# Patient Record
Sex: Male | Born: 1970 | Race: White | Hispanic: No | Marital: Married | Smoking: Never smoker
Health system: Southern US, Community
[De-identification: ages and names within clinical notes are randomized; demographics above are authoritative.]

## PROBLEM LIST (undated history)

## (undated) DIAGNOSIS — E7800 Pure hypercholesterolemia, unspecified: Secondary | ICD-10-CM

## (undated) DIAGNOSIS — E119 Type 2 diabetes mellitus without complications: Secondary | ICD-10-CM

## (undated) HISTORY — PX: ROTATOR CUFF REPAIR: SHX139

---

## 2014-12-30 ENCOUNTER — Ambulatory Visit (HOSPITAL_BASED_OUTPATIENT_CLINIC_OR_DEPARTMENT_OTHER): Payer: BLUE CROSS/BLUE SHIELD | Admitting: Anesthesiology

## 2014-12-30 ENCOUNTER — Ambulatory Visit (HOSPITAL_BASED_OUTPATIENT_CLINIC_OR_DEPARTMENT_OTHER)
Admission: EM | Admit: 2014-12-30 | Discharge: 2014-12-30 | Disposition: A | Discharge status: Home / Self Care | Payer: BLUE CROSS/BLUE SHIELD | Source: Ambulatory Visit | Attending: Orthopedic Surgery | Admitting: Orthopedic Surgery

## 2014-12-30 ENCOUNTER — Encounter (HOSPITAL_BASED_OUTPATIENT_CLINIC_OR_DEPARTMENT_OTHER)
Admission: EM | Disposition: A | Discharge status: Home / Self Care | Payer: Self-pay | Source: Ambulatory Visit | Attending: Orthopedic Surgery

## 2014-12-30 ENCOUNTER — Encounter (HOSPITAL_BASED_OUTPATIENT_CLINIC_OR_DEPARTMENT_OTHER): Payer: Self-pay | Admitting: Orthopedic Surgery

## 2014-12-30 ENCOUNTER — Other Ambulatory Visit: Payer: Self-pay | Admitting: Orthopedic Surgery

## 2014-12-30 DIAGNOSIS — Y939 Activity, unspecified: Secondary | ICD-10-CM | POA: Insufficient documentation

## 2014-12-30 DIAGNOSIS — Y929 Unspecified place or not applicable: Secondary | ICD-10-CM | POA: Diagnosis not present

## 2014-12-30 DIAGNOSIS — Y999 Unspecified external cause status: Secondary | ICD-10-CM | POA: Insufficient documentation

## 2014-12-30 DIAGNOSIS — W228XXA Striking against or struck by other objects, initial encounter: Secondary | ICD-10-CM | POA: Diagnosis not present

## 2014-12-30 DIAGNOSIS — T704XXA Effects of high-pressure fluids, initial encounter: Secondary | ICD-10-CM | POA: Insufficient documentation

## 2014-12-30 DIAGNOSIS — Z79899 Other long term (current) drug therapy: Secondary | ICD-10-CM | POA: Insufficient documentation

## 2014-12-30 HISTORY — PX: INCISION AND DRAINAGE OF WOUND: SHX1803

## 2014-12-30 HISTORY — DX: Type 2 diabetes mellitus without complications: E11.9

## 2014-12-30 HISTORY — DX: Pure hypercholesterolemia, unspecified: E78.00

## 2014-12-30 LAB — POCT HEMOGLOBIN-HEMACUE: Hemoglobin: 14.7 g/dL (ref 13.0–17.0)

## 2014-12-30 SURGERY — IRRIGATION AND DEBRIDEMENT WOUND
Anesthesia: Monitor Anesthesia Care | Site: Finger | Laterality: Right

## 2014-12-30 MED ORDER — MIDAZOLAM HCL 2 MG/2ML IJ SOLN
INTRAMUSCULAR | Status: AC
  Filled 2014-12-30: qty 2

## 2014-12-30 MED ORDER — CEFAZOLIN SODIUM-DEXTROSE 2-3 GM-% IV SOLR
INTRAVENOUS | Status: AC
  Filled 2014-12-30: qty 50

## 2014-12-30 MED ORDER — FENTANYL CITRATE 0.05 MG/ML IJ SOLN: 25.0000 ug | INTRAMUSCULAR | Status: DC | PRN

## 2014-12-30 MED ORDER — FENTANYL CITRATE 0.05 MG/ML IJ SOLN
INTRAMUSCULAR | Status: AC
  Filled 2014-12-30: qty 4

## 2014-12-30 MED ORDER — ONDANSETRON HCL 4 MG/2ML IJ SOLN
INTRAMUSCULAR | Status: DC | PRN
  Administered 2014-12-30: 4 mg via INTRAVENOUS

## 2014-12-30 MED ORDER — FENTANYL CITRATE 0.05 MG/ML IJ SOLN: 50.0000 ug | INTRAMUSCULAR | Status: DC | PRN

## 2014-12-30 MED ORDER — FENTANYL CITRATE 0.05 MG/ML IJ SOLN
INTRAMUSCULAR | Status: DC | PRN
  Administered 2014-12-30 (×2): 50 ug via INTRAVENOUS

## 2014-12-30 MED ORDER — HYDROCODONE-ACETAMINOPHEN 5-325 MG PO TABS: 1.0000 | ORAL_TABLET | Freq: Four times a day (QID) | ORAL | Status: DC | PRN

## 2014-12-30 MED ORDER — CEFAZOLIN SODIUM-DEXTROSE 2-3 GM-% IV SOLR
INTRAVENOUS | Status: DC | PRN
Start: 2014-12-30 — End: 2014-12-30
  Administered 2014-12-30: 2 g via INTRAVENOUS

## 2014-12-30 MED ORDER — CHLORHEXIDINE GLUCONATE 4 % EX LIQD: 60.0000 mL | Freq: Once | CUTANEOUS | Status: DC

## 2014-12-30 MED ORDER — CEPHALEXIN 500 MG PO CAPS: 500.0000 mg | ORAL_CAPSULE | Freq: Four times a day (QID) | ORAL | Status: DC

## 2014-12-30 MED ORDER — CEFAZOLIN SODIUM-DEXTROSE 2-3 GM-% IV SOLR: 2.0000 g | INTRAVENOUS | Status: DC

## 2014-12-30 MED ORDER — LACTATED RINGERS IV SOLN
INTRAVENOUS | Status: DC
  Administered 2014-12-30: 14:00:00 via INTRAVENOUS

## 2014-12-30 MED ORDER — ONDANSETRON HCL 4 MG/2ML IJ SOLN: 4.0000 mg | Freq: Four times a day (QID) | INTRAMUSCULAR | Status: DC | PRN

## 2014-12-30 MED ORDER — OXYCODONE HCL 5 MG PO TABS: 5.0000 mg | ORAL_TABLET | Freq: Once | ORAL | Status: AC | PRN

## 2014-12-30 MED ORDER — MIDAZOLAM HCL 2 MG/2ML IJ SOLN: 1.0000 mg | INTRAMUSCULAR | Status: DC | PRN

## 2014-12-30 MED ORDER — PROPOFOL INFUSION 10 MG/ML OPTIME
INTRAVENOUS | Status: DC | PRN
  Administered 2014-12-30: 75 ug/kg/min via INTRAVENOUS

## 2014-12-30 MED ORDER — OXYCODONE HCL 5 MG/5ML PO SOLN: 5.0000 mg | Freq: Once | ORAL | Status: AC | PRN

## 2014-12-30 MED ORDER — MIDAZOLAM HCL 5 MG/5ML IJ SOLN
INTRAMUSCULAR | Status: DC | PRN
  Administered 2014-12-30: 2 mg via INTRAVENOUS

## 2014-12-30 MED ORDER — LIDOCAINE HCL (PF) 0.5 % IJ SOLN
INTRAMUSCULAR | Status: DC | PRN
  Administered 2014-12-30: 20 mL via INTRAVENOUS

## 2014-12-30 MED ORDER — BUPIVACAINE HCL (PF) 0.25 % IJ SOLN
INTRAMUSCULAR | Status: DC | PRN
  Administered 2014-12-30: 8 mL

## 2014-12-30 SURGICAL SUPPLY — 47 items
BAG DECANTER FOR FLEXI CONT (MISCELLANEOUS) IMPLANT
BLADE MINI RND TIP GREEN BEAV (BLADE) IMPLANT
BLADE SURG 15 STRL LF DISP TIS (BLADE) ×1 IMPLANT
BLADE SURG 15 STRL SS (BLADE) ×1
BNDG COHESIVE 1X5 TAN STRL LF (GAUZE/BANDAGES/DRESSINGS) ×2 IMPLANT
BNDG COHESIVE 3X5 TAN STRL LF (GAUZE/BANDAGES/DRESSINGS) IMPLANT
BNDG ESMARK 4X9 LF (GAUZE/BANDAGES/DRESSINGS) IMPLANT
BNDG GAUZE ELAST 4 BULKY (GAUZE/BANDAGES/DRESSINGS) IMPLANT
CHLORAPREP W/TINT 26ML (MISCELLANEOUS) ×2 IMPLANT
CORDS BIPOLAR (ELECTRODE) ×2 IMPLANT
COVER BACK TABLE 60X90IN (DRAPES) ×2 IMPLANT
COVER MAYO STAND STRL (DRAPES) ×2 IMPLANT
CUFF TOURNIQUET SINGLE 18IN (TOURNIQUET CUFF) ×2 IMPLANT
DRAPE EXTREMITY T 121X128X90 (DRAPE) ×2 IMPLANT
DRAPE SURG 17X23 STRL (DRAPES) ×2 IMPLANT
DRSG KUZMA FLUFF (GAUZE/BANDAGES/DRESSINGS) IMPLANT
GAUZE PACKING IODOFORM 1/4X15 (GAUZE/BANDAGES/DRESSINGS) ×2 IMPLANT
GAUZE SPONGE 4X4 12PLY STRL (GAUZE/BANDAGES/DRESSINGS) ×2 IMPLANT
GAUZE XEROFORM 1X8 LF (GAUZE/BANDAGES/DRESSINGS) IMPLANT
GLOVE BIO SURGEON STRL SZ 6.5 (GLOVE) ×2 IMPLANT
GLOVE BIOGEL PI IND STRL 7.0 (GLOVE) ×1 IMPLANT
GLOVE BIOGEL PI IND STRL 8.5 (GLOVE) ×1 IMPLANT
GLOVE BIOGEL PI INDICATOR 7.0 (GLOVE) ×1
GLOVE BIOGEL PI INDICATOR 8.5 (GLOVE) ×1
GLOVE SURG ORTHO 8.0 STRL STRW (GLOVE) ×2 IMPLANT
GOWN STRL REUS W/ TWL LRG LVL3 (GOWN DISPOSABLE) ×1 IMPLANT
GOWN STRL REUS W/TWL LRG LVL3 (GOWN DISPOSABLE) ×1
GOWN STRL REUS W/TWL XL LVL3 (GOWN DISPOSABLE) ×4 IMPLANT
LOOP VESSEL MAXI BLUE (MISCELLANEOUS) IMPLANT
NEEDLE PRECISIONGLIDE 27X1.5 (NEEDLE) ×2 IMPLANT
NS IRRIG 1000ML POUR BTL (IV SOLUTION) ×2 IMPLANT
PACK BASIN DAY SURGERY FS (CUSTOM PROCEDURE TRAY) ×2 IMPLANT
PAD CAST 3X4 CTTN HI CHSV (CAST SUPPLIES) IMPLANT
PADDING CAST ABS 4INX4YD NS (CAST SUPPLIES)
PADDING CAST ABS COTTON 4X4 ST (CAST SUPPLIES) IMPLANT
PADDING CAST COTTON 3X4 STRL (CAST SUPPLIES)
SPLINT FINGER 3.25 BULB 911905 (SOFTGOODS) ×2 IMPLANT
SPLINT PLASTER CAST XFAST 3X15 (CAST SUPPLIES) IMPLANT
SPLINT PLASTER XTRA FASTSET 3X (CAST SUPPLIES)
STOCKINETTE 4X48 STRL (DRAPES) ×2 IMPLANT
SWAB COLLECTION DEVICE MRSA (MISCELLANEOUS) ×2 IMPLANT
SYR BULB 3OZ (MISCELLANEOUS) ×2 IMPLANT
SYR CONTROL 10ML LL (SYRINGE) ×2 IMPLANT
TOWEL OR 17X24 6PK STRL BLUE (TOWEL DISPOSABLE) ×2 IMPLANT
TUBE ANAEROBIC SPECIMEN COL (MISCELLANEOUS) ×2 IMPLANT
TUBE FEEDING 5FR 15 INCH (TUBING) IMPLANT
UNDERPAD 30X30 INCONTINENT (UNDERPADS AND DIAPERS) IMPLANT

## 2014-12-30 NOTE — H&P (Signed)
   Carlos [name] is an 44 y.o. male.   Chief Complaint: sweling right index finger HPI: [age] yo right hand dominant male who suffer injection injury to right index finger when his paint gun failed to work. He pressed the tip and the gun functioned. The paint is latex based. The injury occurred on Feb 27. 2016. He did not seek attention until today, when the finger started swelling on the pulp of the distal phalanx. He has no prior injury. He is borderline diabetic. PMH: Allergy - none         Meds: statin, valtrex, flownase         Surgery: rotator cuff repair  FH: MI father SH: Smoke - none        ETOH: occas beer ROS: Neg  History reviewed. No pertinent past medical history.  History reviewed. No pertinent past surgical history.  History reviewed. No pertinent family history. Social History:  has no tobacco, alcohol, and drug history on file.  Allergies: Not on File  No prescriptions prior to admission    No results found for this or any previous visit (from the past 48 hour(s)).  No results found.   Pertinent items are noted in HPI.  There were no vitals taken for this visit.  General appearance: alert, cooperative and appears stated age Head: Normocephalic, without obvious abnormality Neck: no JVD Resp: clear to auscultation bilaterally Cardio: regular rate and rhythm, S1, S2 normal, no murmur, click, rub or gallop GI: soft, non-tender; bowel sounds normal; no masses,  no organomegaly Extremities: swelling redness with puncture wound right index pulp Pulses: 2+ and symmetric Skin: erythema and swelling right index finger Neurologic: Grossly normal Incision/Wound: Puncture wound right index finger  Assessment/Plan Injection paint right index finger Plan I and D  Pt is aware of the severity of the injury infection, stiffness, loss of sensation,including the potential  of loss part or all of the finger.  Carlos [name] R 12/30/2014, 10:37 AM

## 2014-12-30 NOTE — Op Note (Signed)
Dictation Number [id number]

## 2014-12-30 NOTE — Anesthesia Postprocedure Evaluation (Signed)
Anesthesia Post Note  Patient: Carlos NineRobert B [name]  Procedure(s) Performed: Procedure(s) (LRB): INCISION AND DRAINAGE RIGHT FLEXOR SHEATH   (Right)  Anesthesia type: MAC  Patient location: PACU  Post pain: Pain level controlled and Adequate analgesia  Post assessment: Post-op Vital signs reviewed, Patient's Cardiovascular Status Stable and Respiratory Function Stable  Last Vitals:  Filed Vitals:   12/30/14 1630  BP: 126/79  Pulse: 52  Temp: 36.6 C  Resp: 16    Post vital signs: Reviewed and stable  Level of consciousness: awake, alert  and oriented  Complications: No apparent anesthesia complications

## 2014-12-30 NOTE — Transfer of Care (Signed)
Immediate Anesthesia Transfer of Care Note  Patient: Carlos [name]  Procedure(s) Performed: Procedure(s): INCISION AND DRAINAGE RIGHT FLEXOR SHEATH   (Right)  Patient Location: PACU  Anesthesia Type:Bier block  Level of Consciousness: awake and patient cooperative  Airway & Oxygen Therapy: Patient Spontanous Breathing and Patient connected to face mask oxygen  Post-op Assessment: Report given to RN and Post -op Vital signs reviewed and stable  Post vital signs: Reviewed and stable  Last Vitals:  Filed Vitals:   12/30/14 1610  BP:   Pulse: 49  Temp:   Resp: 11    Complications: No apparent anesthesia complications

## 2014-12-30 NOTE — Brief Op Note (Signed)
12/30/2014  4:01 PM  PATIENT:  Carlos [name]  44 y.o. male  PRE-OPERATIVE DIAGNOSIS:  injection injury right index finger   POST-OPERATIVE DIAGNOSIS:  injection injury right index finger   PROCEDURE:  Procedure(s): INCISION AND DRAINAGE RIGHT FLEXOR SHEATH   (Right)  SURGEON:  Surgeon(s) and Role:    * Cindee SaltGary Kylyn Sookram, MD - Primary    * Betha LoaKevin Jayse Hodkinson, MD - Assisting  PHYSICIAN ASSISTANT:   ASSISTANTS: K Faduma Cho,MD   ANESTHESIA:   regional and local  EBL:     BLOOD ADMINISTERED:none  DRAINS: packed  LOCAL MEDICATIONS USED:  BUPIVICAINE   SPECIMEN:  Source of Specimen:  cultures  DISPOSITION OF SPECIMEN:  PATHOLOGY  COUNTS:  YES  TOURNIQUET:   Total Tourniquet Time Documented: Forearm (Right) - 20 minutes Total: Forearm (Right) - 20 minutes   DICTATION: .Other Dictation: Dictation Number [id number]  PLAN OF CARE: Discharge to home after PACU  PATIENT DISPOSITION:  PACU - hemodynamically stable.

## 2014-12-30 NOTE — Anesthesia Preprocedure Evaluation (Signed)
Anesthesia Evaluation  Patient identified by MRN, date of birth, ID band Patient awake    Reviewed: Allergy & Precautions, NPO status , Patient's Chart, lab work & pertinent test results  Airway Mallampati: II   Neck ROM: full    Dental   Pulmonary neg pulmonary ROS,          Cardiovascular negative cardio ROS      Neuro/Psych    GI/Hepatic   Endo/Other    Renal/GU      Musculoskeletal   Abdominal   Peds  Hematology   Anesthesia Other Findings   Reproductive/Obstetrics                             Anesthesia Physical Anesthesia Plan  ASA: II  Anesthesia Plan: MAC   Post-op Pain Management:    Induction: Intravenous  Airway Management Planned: Simple Face Mask  Additional Equipment:   Intra-op Plan:   Post-operative Plan:   Informed Consent: I have reviewed the patients History and Physical, chart, labs and discussed the procedure including the risks, benefits and alternatives for the proposed anesthesia with the patient or authorized representative who has indicated his/her understanding and acceptance.     Plan Discussed with: CRNA, Anesthesiologist and Surgeon  Anesthesia Plan Comments:         Anesthesia Quick Evaluation

## 2014-12-30 NOTE — Anesthesia Procedure Notes (Addendum)
Anesthesia Regional Block:  Bier block (IV Regional)  Pre-Anesthetic Checklist: ,, timeout performed, Correct Patient, Correct Site, Correct Laterality, Correct Procedure, Correct Position, site marked, Risks and benefits discussed,  Surgical consent,  Pre-op evaluation,  At surgeon's request and post-op pain management  Laterality: Right     Bier block (IV Regional) Narrative:   Performed by: Personally

## 2014-12-30 NOTE — Discharge Instructions (Addendum)
[facility] Instructions °Hand Surgery ° °Wound Care: °Keep your hand elevated above the level of your heart.  Do not allow it to dangle by your side.  Keep the dressing dry and do not remove it unless your doctor advises you to do so.  He will usually change it at the time of your post-op visit.  Moving your fingers is advised to stimulate circulation but will depend on the site of your surgery.  If you have a splint applied, your doctor will advise you regarding movement. ° °Activity: °Do not drive or operate machinery today.  Rest today and then you may return to your normal activity and work as indicated by your physician. ° °Diet:  °Drink liquids today or eat a light diet.  You may resume a regular diet tomorrow.   ° °General expectations: °Pain for two to three days. °Fingers may become slightly swollen. ° °Call your doctor if any of the following occur: °Severe pain not relieved by pain medication. °Elevated temperature. °Dressing soaked with blood. °Inability to move fingers. °White or bluish color to fingers. ° ° ° °Post Anesthesia Home Care Instructions ° °Activity: °Get plenty of rest for the remainder of the day. A responsible adult should stay with you for 24 hours following the procedure.  °For the next 24 hours, DO NOT: °-Drive a car °-Operate machinery °-Drink alcoholic beverages °-Take any medication unless instructed by your physician °-Make any legal decisions or sign important papers. ° °Meals: °Start with liquid foods such as gelatin or soup. Progress to regular foods as tolerated. Avoid greasy, spicy, heavy foods. If nausea and/or vomiting occur, drink only clear liquids until the nausea and/or vomiting subsides. Call your physician if vomiting continues. ° °Special Instructions/Symptoms: °Your throat may feel dry or sore from the anesthesia or the breathing tube placed in your throat during surgery. If this causes discomfort, gargle with warm salt water. The discomfort should disappear within  24 hours. ° °Call your surgeon if you experience:  ° °1.  Fever over 101.0. °2.  Inability to urinate. °3.  Nausea and/or vomiting. °4.  Extreme swelling or bruising at the surgical site. °5.  Continued bleeding from the incision. °6.  Increased pain, redness or drainage from the incision. °7.  Problems related to your pain medication. °8. Any change in color, movement and/or sensation °9. Any problems and/or concerns ° ° ° °

## 2014-12-31 ENCOUNTER — Encounter (HOSPITAL_BASED_OUTPATIENT_CLINIC_OR_DEPARTMENT_OTHER): Payer: Self-pay | Admitting: Orthopedic Surgery

## 2014-12-31 NOTE — Op Note (Signed)
NAMGrace Isaac:  [name], Carlos                 ACCOUNT NO.:  [id number]  MEDICAL RECORD NO.:  [id number]  LOCATION:                                 FACILITY:  PHYSICIAN:  Cindee SaltGary Shantella Blubaugh, M.D.            DATE OF BIRTH:  DATE OF PROCEDURE:  12/30/2014 DATE OF DISCHARGE:                              OPERATIVE REPORT   PREOPERATIVE DIAGNOSIS:  Injection injury with paint, right index finger.  POSTOPERATIVE DIAGNOSIS:  Injection injury with paint, right index finger.  OPERATION:  Debridement of right index finger pulp.  SURGEON:  Cindee SaltGary Lylee Corrow, M.D.  ASSISTANT:  Betha LoaKevin Baylin Gamblin, MD  ANESTHESIA:  Forearm IV regional with metacarpal block.  ANESTHESIOLOGIST:  Achille RichAdam Hodierne, MD.  HISTORY:  The patient is a 44 year old right-hand-dominant male who was attempting to clear a paint gun, which was appeared to be malfunctioning, was not working.  He hit the tip, this resulted in an injection of latex paint into the tip of his right index finger.  The injury occurred approximately 3 days ago.  He is seen with increased swelling, pain, and discomfort.  He has advised to undergo incision and drainage of this.  X-rays revealed the paint localized to the pulp of his right index finger.  He is aware of risks and complications including infection, significant scarring, loss of sensibility, stiffness, potentially even loss of the finger.  He is elected to proceed with debridement.  In preoperative area, the patient is seen, the extremity marked by both patient and surgeon.  Antibiotic given.  PROCEDURE IN DETAIL:  The patient was brought to the operating room where forearm IV regional anesthetic was carried out without difficulty. He was prepped using ChloraPrep in supine position with the right arm free.  A 3-minute dry time was allowed.  Time-out taken, confirming the patient and procedure.  A metacarpal block was given with 0.25% bupivacaine without epinephrine approximately 8 mL was used.  The incision was  made directly over the entrance wound of the injection on the pulp carried down through subcutaneous tissue.  White paint was immediately encountered.  This was isolated, debrided as much as possible.  It appeared to go distally rather than proximally.  The tissues surrounding the flexor tendon did not show any paint in the flexor sheath distally and as such it was not opened.  The area was thoroughly debrided, irrigated, and cultures were taken before irrigation for both aerobic and anaerobic cultures.  Following the irrigation, the area was packed open.  The skin edges were sharply debrided as was the tissue with the paint imbedded within it.  A sterile compressive dressing and splint to the finger applied.  On deflation of the tourniquet, remaining fingers pinked.  He was taken to the recovery room for observation in satisfactory condition.  [facility] be [facility].  He will be discharged on Norco and Keflex.          ______________________________ Cindee SaltGary Jasha Hodzic, M.D.     GK/MEDQ  D:  12/30/2014  T:  12/31/2014  Job:  [id number]

## 2015-01-02 LAB — WOUND CULTURE: Culture: NO GROWTH

## 2015-01-04 LAB — ANAEROBIC CULTURE

## 2016-02-24 ENCOUNTER — Encounter: Payer: Self-pay | Admitting: Sports Medicine

## 2016-02-24 ENCOUNTER — Ambulatory Visit (INDEPENDENT_AMBULATORY_CARE_PROVIDER_SITE_OTHER): Payer: 59 | Admitting: Sports Medicine

## 2016-02-24 VITALS — BP 135/79 | HR 84 | Ht 73.0 in | Wt 230.0 lb

## 2016-02-24 DIAGNOSIS — M25462 Effusion, left knee: Secondary | ICD-10-CM

## 2016-02-24 MED ORDER — MELOXICAM 15 MG PO TABS: ORAL_TABLET | ORAL | Status: AC

## 2016-02-24 NOTE — Progress Notes (Signed)
   Subjective:    Patient ID: Carlos [name], male    DOB: 09/13/1971, 45 y.o.   MRN: [id number]  HPI chief complaint: Left knee pain  Carlos [name] is a very pleasant 45 year old male that comes in today complaining of 4-6 weeks of left knee discomfort. He states that the knee is not terribly painful but instead is somewhat uncomfortable due to some swelling and stiffness. His symptoms all began after he started a new workout routine which consisted of doing some squats and lunges. He had immediate onset of stiffness and a tight feeling in the knee which has persisted. No feelings of instability. It is most noticeable with getting into a deep squat. No locking or catching. He has taken intermittent doses of over-the-counter ibuprofen but is not sure whether or not that is been helpful. He did suffer a knee injury about 5 years ago while playing basketball but it was not severe enough that he sought medical treatment. He has not had any x-rays. No prior knee surgeries. No pain at rest. No numbness or tingling.  Past medical history reviewed Medications reviewed Allergies reviewed    Review of Systems    as above Objective:   Physical Exam  Overweight. No acute distress. Awake alert and oriented 3. Vital signs reviewed  Left knee: Range of motion is 0-130. This is in comparison to the uninvolved right knee which can get to 140 of flexion. Trace effusion. 1-2+ patellofemoral crepitus. Negative patellar grind. Knee is stable to valgus and varus stressing. Negative Lachman's, negative anterior drawer. Negative posterior drawer. No tenderness to palpation along the medial or lateral joint lines. Negative McMurray's. Negative Thessaly's. He does have pain with deep squatting and audible crepitus. Neurovascularly intact distally. Walking without a limp.     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 He's elected to try mobic 15 mg daily for the next 7 days and then as needed. He will also utilize a body helix compression sleeve with activity. He will avoid closed chain lower body exercises but he is okay to continue with upper body strengthening and cycling (he has a recumbent bike at home). Patient will follow-up with me in 3 weeks for reevaluation. If symptoms persist then I would reconsider a cortisone injection as well as x-rays.

## 2016-03-17 ENCOUNTER — Ambulatory Visit: Payer: 59 | Admitting: Sports Medicine

## 2016-12-12 DIAGNOSIS — E7800 Pure hypercholesterolemia, unspecified: Secondary | ICD-10-CM | POA: Diagnosis not present

## 2016-12-12 DIAGNOSIS — J309 Allergic rhinitis, unspecified: Secondary | ICD-10-CM | POA: Diagnosis not present

## 2016-12-12 DIAGNOSIS — E119 Type 2 diabetes mellitus without complications: Secondary | ICD-10-CM | POA: Diagnosis not present

## 2017-06-27 DIAGNOSIS — E1165 Type 2 diabetes mellitus with hyperglycemia: Secondary | ICD-10-CM | POA: Diagnosis not present

## 2017-06-27 DIAGNOSIS — J309 Allergic rhinitis, unspecified: Secondary | ICD-10-CM | POA: Diagnosis not present

## 2017-06-27 DIAGNOSIS — E119 Type 2 diabetes mellitus without complications: Secondary | ICD-10-CM | POA: Diagnosis not present

## 2017-06-27 DIAGNOSIS — Z125 Encounter for screening for malignant neoplasm of prostate: Secondary | ICD-10-CM | POA: Diagnosis not present

## 2017-06-27 DIAGNOSIS — E7800 Pure hypercholesterolemia, unspecified: Secondary | ICD-10-CM | POA: Diagnosis not present

## 2017-06-29 ENCOUNTER — Other Ambulatory Visit: Payer: Self-pay | Admitting: Family Medicine

## 2017-06-29 DIAGNOSIS — R748 Abnormal levels of other serum enzymes: Secondary | ICD-10-CM

## 2017-07-07 ENCOUNTER — Ambulatory Visit
Admission: RE | Admit: 2017-07-07 | Discharge: 2017-07-07 | Disposition: A | Discharge status: Home / Self Care | Payer: 59 | Source: Ambulatory Visit | Attending: Family Medicine | Admitting: Family Medicine

## 2017-07-07 DIAGNOSIS — R748 Abnormal levels of other serum enzymes: Secondary | ICD-10-CM

## 2017-07-14 ENCOUNTER — Ambulatory Visit
Admission: RE | Admit: 2017-07-14 | Discharge: 2017-07-14 | Disposition: A | Discharge status: Home / Self Care | Payer: 59 | Source: Ambulatory Visit | Attending: Family Medicine | Admitting: Family Medicine

## 2017-07-14 DIAGNOSIS — R7989 Other specified abnormal findings of blood chemistry: Secondary | ICD-10-CM | POA: Diagnosis not present

## 2017-12-29 DIAGNOSIS — J309 Allergic rhinitis, unspecified: Secondary | ICD-10-CM | POA: Diagnosis not present

## 2017-12-29 DIAGNOSIS — E119 Type 2 diabetes mellitus without complications: Secondary | ICD-10-CM | POA: Diagnosis not present

## 2017-12-29 DIAGNOSIS — E7800 Pure hypercholesterolemia, unspecified: Secondary | ICD-10-CM | POA: Diagnosis not present

## 2018-03-14 DIAGNOSIS — E119 Type 2 diabetes mellitus without complications: Secondary | ICD-10-CM | POA: Diagnosis not present

## 2018-04-06 DIAGNOSIS — E119 Type 2 diabetes mellitus without complications: Secondary | ICD-10-CM | POA: Diagnosis not present

## 2018-07-05 DIAGNOSIS — Z23 Encounter for immunization: Secondary | ICD-10-CM | POA: Diagnosis not present

## 2018-07-05 DIAGNOSIS — E119 Type 2 diabetes mellitus without complications: Secondary | ICD-10-CM | POA: Diagnosis not present

## 2018-07-05 DIAGNOSIS — E7800 Pure hypercholesterolemia, unspecified: Secondary | ICD-10-CM | POA: Diagnosis not present

## 2018-10-05 DIAGNOSIS — E1165 Type 2 diabetes mellitus with hyperglycemia: Secondary | ICD-10-CM | POA: Diagnosis not present

## 2019-01-03 DIAGNOSIS — E7800 Pure hypercholesterolemia, unspecified: Secondary | ICD-10-CM | POA: Diagnosis not present

## 2019-01-03 DIAGNOSIS — E1169 Type 2 diabetes mellitus with other specified complication: Secondary | ICD-10-CM | POA: Diagnosis not present

## 2019-01-06 IMAGING — US US ABDOMEN LIMITED
1 series · 14 of 25 positions shown · non-contrast
Comparison: No recent prior .

CLINICAL DATA: Elevated LFTs.

EXAM:
ULTRASOUND ABDOMEN LIMITED RIGHT UPPER QUADRANT

[Series 1: us abdomen limited · 0.22mm/px · 14 of 40 slices shown]
[im 1/40]
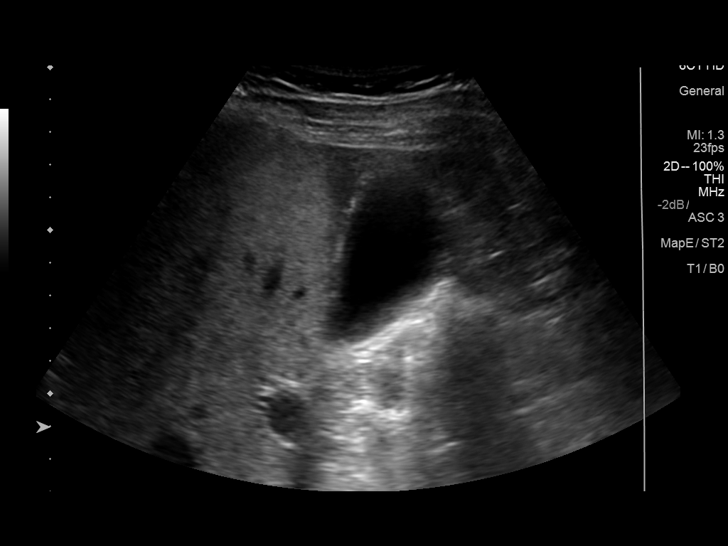
[im 4/40]
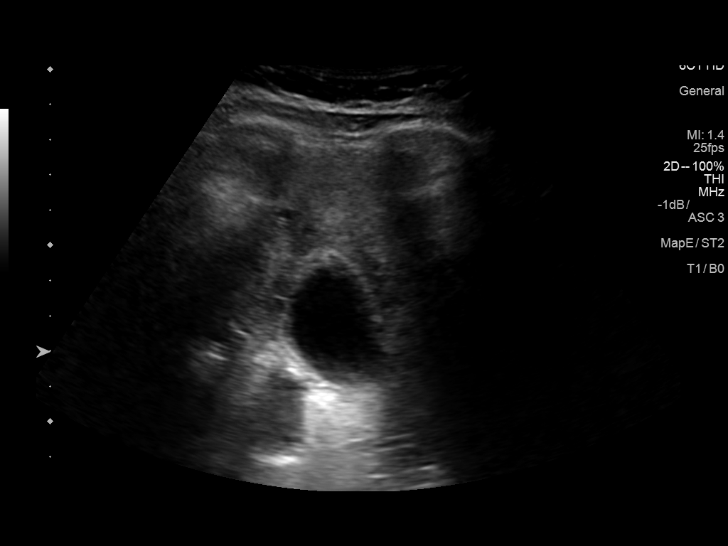
[im 7/40]
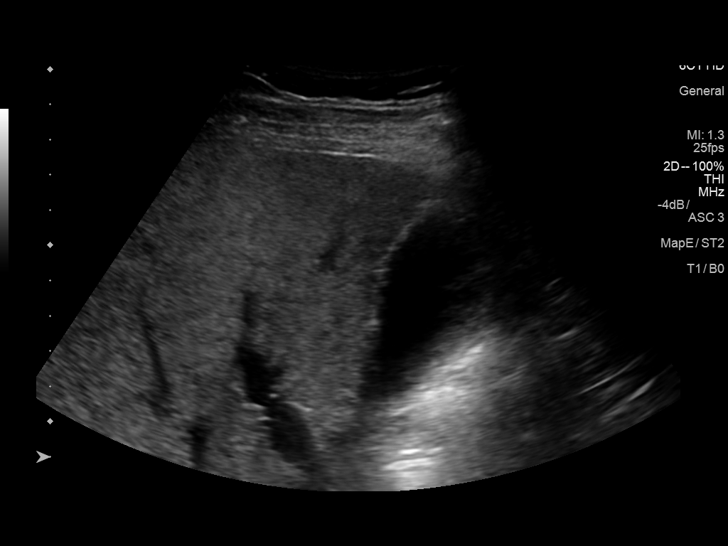
[im 10/40]
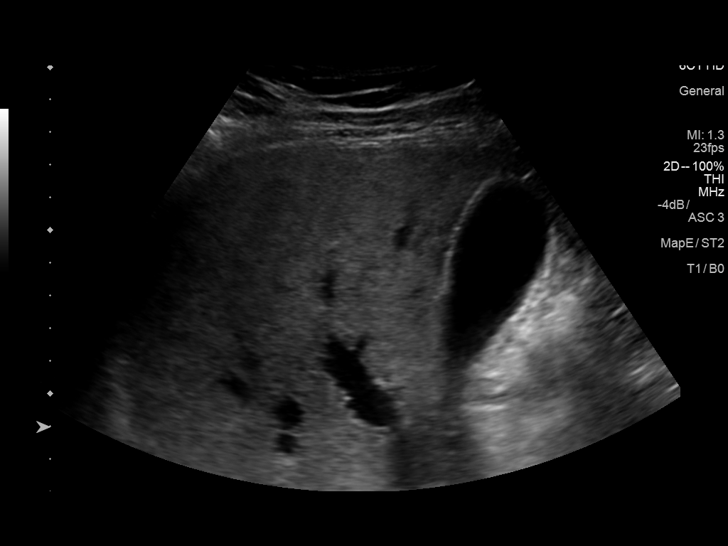
[im 14/40]
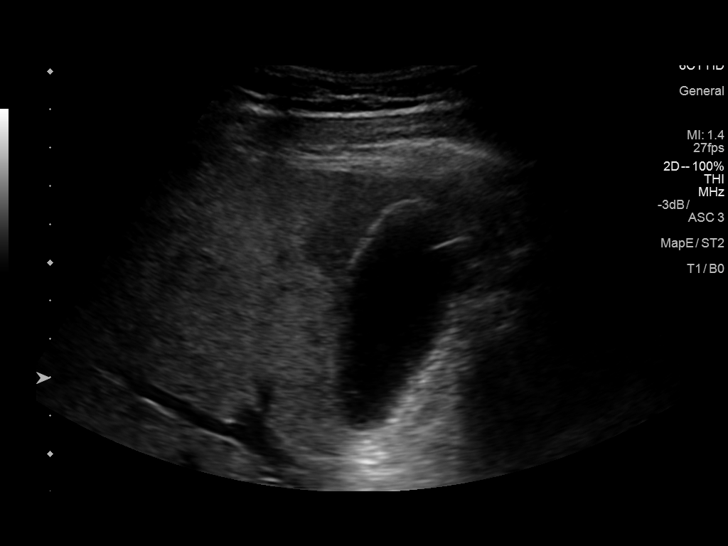
[im 15/40]
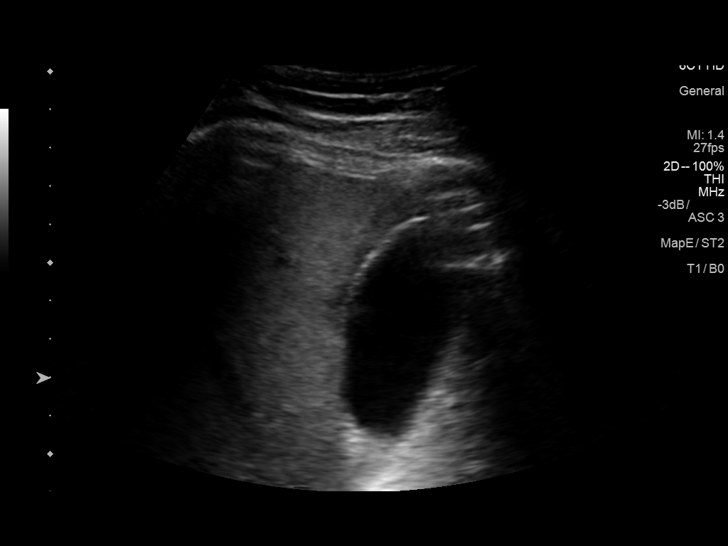
[im 18/40]
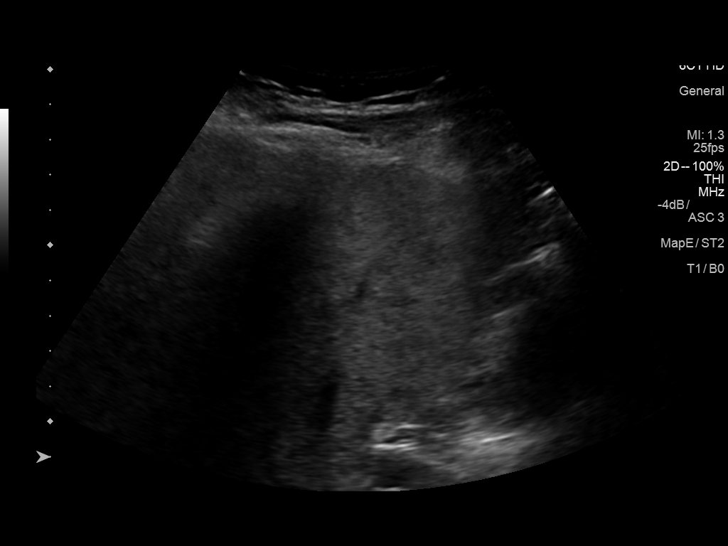
[im 22/40]
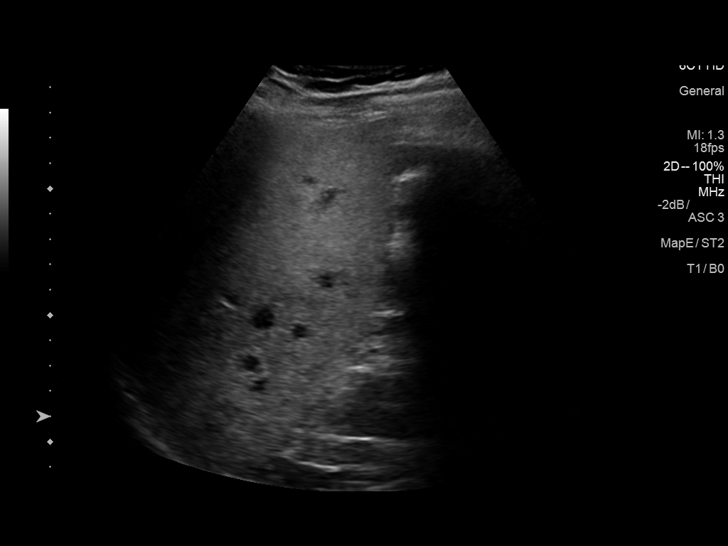
[im 25/40]
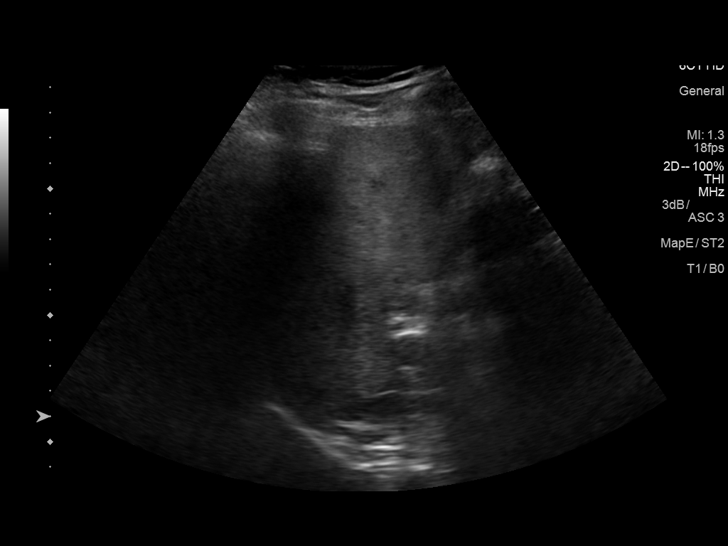
[im 27/40]
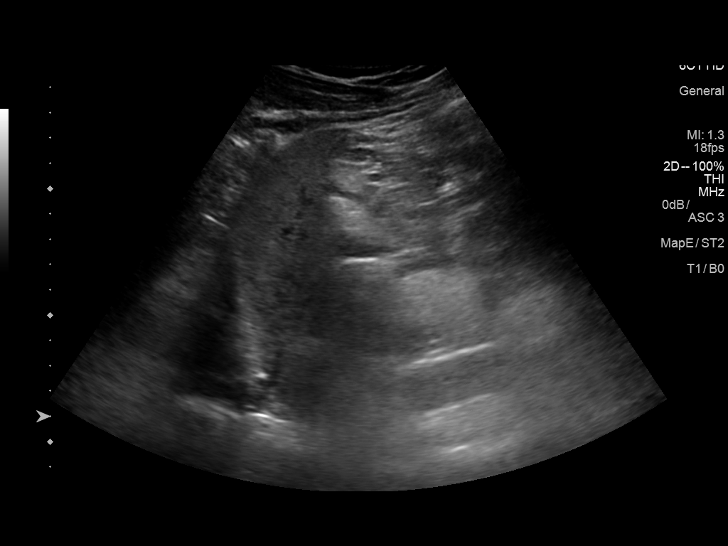
[im 30/40]
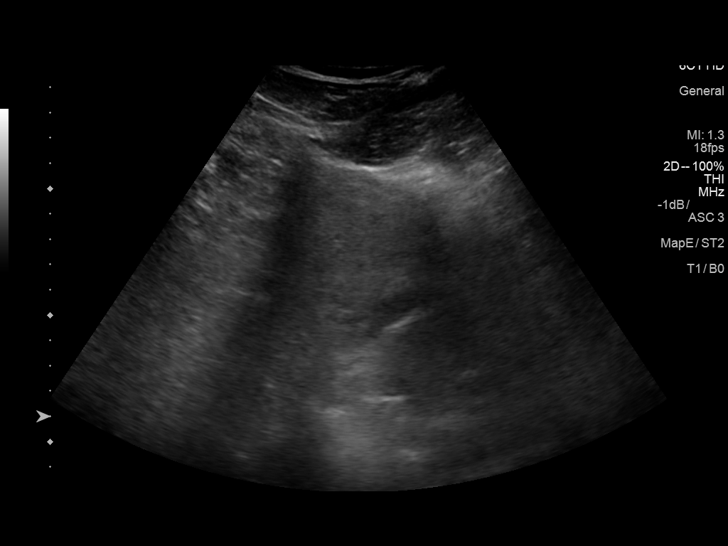
[im 33/40]
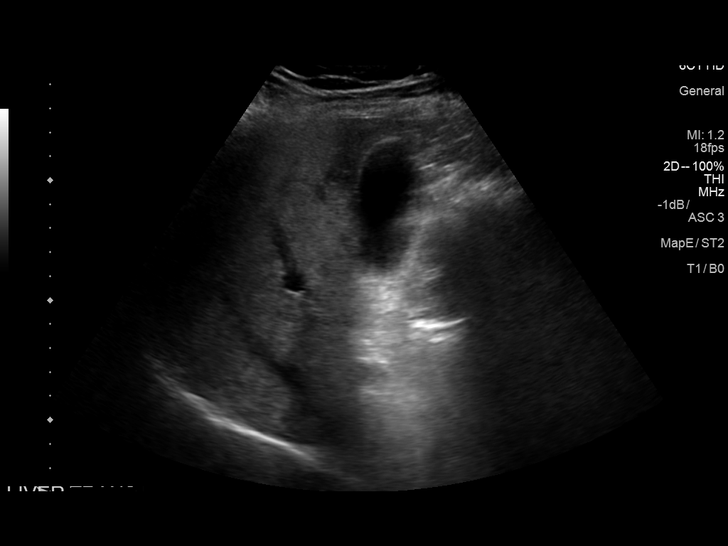
[im 36/40]
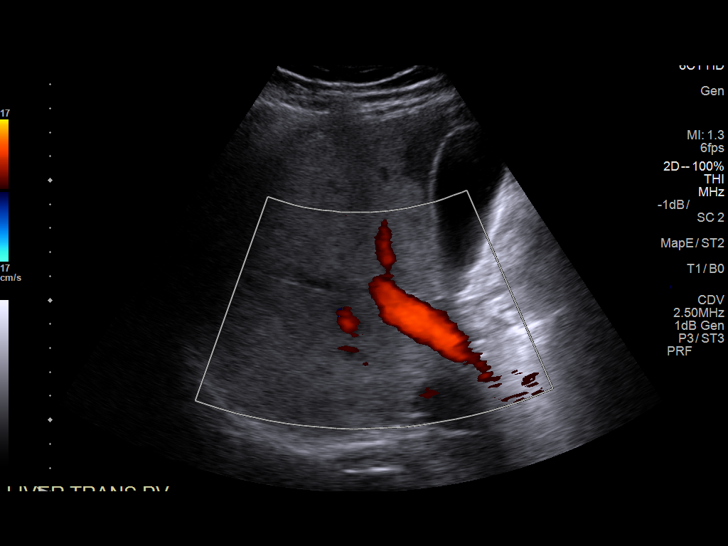
[im 40/40]
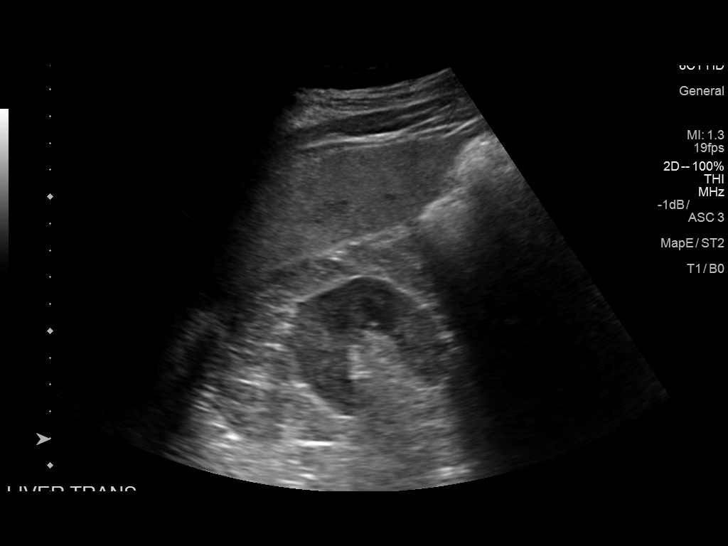

[14 of 25 positions shown; findings below may reference images not displayed]

FINDINGS: Gallbladder:

No gallstones. Gallbladder wall measures 3.2 mm appearing slightly
thickened and irregular. This is most likely related to partially
contracted state . No sonographic Murphy sign noted by sonographer.

Common bile duct:

Diameter: 3.0 mm

Liver:

Increased echogenicity consistent fatty infiltration and/or
hepatocellular disease. Area decrease echogenicity noted adjacent to
the gallbladder fossa most consistent focal fatty sparing. Portal
vein is patent on color Doppler imaging with normal direction of
blood flow towards the liver.
IMPRESSION: 1. Increased echogenicity of the liver consistent fatty infiltration
or hepatocellular disease. Focal fatty sparing adjacent to the
gallbladder fossa.

2. No gallstones or biliary distention.

## 2019-03-20 DIAGNOSIS — E119 Type 2 diabetes mellitus without complications: Secondary | ICD-10-CM | POA: Diagnosis not present

## 2019-10-29 ENCOUNTER — Ambulatory Visit: Payer: 59 | Attending: Internal Medicine

## 2019-10-29 DIAGNOSIS — Z20822 Contact with and (suspected) exposure to covid-19: Secondary | ICD-10-CM

## 2019-10-31 LAB — NOVEL CORONAVIRUS, NAA: SARS-CoV-2, NAA: NOT DETECTED

## 2020-09-15 ENCOUNTER — Ambulatory Visit: Payer: 59 | Attending: Internal Medicine

## 2020-09-15 DIAGNOSIS — Z23 Encounter for immunization: Secondary | ICD-10-CM

## 2020-09-15 NOTE — Progress Notes (Signed)
   Covid-19 Vaccination Clinic  Name:  INIOLUWA BARIS    MRN: [id number] DOB: 17-Dec-1970  09/15/2020  Mr. Palma was observed post Covid-19 immunization for 15 minutes without incident. He was provided with Vaccine Information Sheet and instruction to access the V-Safe system.   Mr. Erdahl was instructed to call 911 with any severe reactions post vaccine: Marland Kitchen Difficulty breathing  . Swelling of face and throat  . A fast heartbeat  . A bad rash all over body  . Dizziness and weakness   Immunizations Administered    Name Date Dose VIS Date Route   Pfizer COVID-19 Vaccine 09/15/2020  4:09 PM 0.3 mL 08/19/2020 Intramuscular   Manufacturer: [employer], [employer]   Lot: [id number]   NDC: [address]-3

## 2021-01-05 ENCOUNTER — Ambulatory Visit
Admission: RE | Admit: 2021-01-05 | Discharge: 2021-01-05 | Disposition: A | Discharge status: Home / Self Care | Payer: 59 | Source: Ambulatory Visit | Attending: Family Medicine | Admitting: Family Medicine

## 2021-01-05 ENCOUNTER — Other Ambulatory Visit: Payer: Self-pay | Admitting: Family Medicine

## 2021-01-05 DIAGNOSIS — R0689 Other abnormalities of breathing: Secondary | ICD-10-CM

## 2021-01-26 ENCOUNTER — Other Ambulatory Visit: Payer: Self-pay | Admitting: Family Medicine

## 2021-01-26 ENCOUNTER — Other Ambulatory Visit (HOSPITAL_COMMUNITY): Payer: Self-pay | Admitting: Family Medicine

## 2021-01-26 DIAGNOSIS — J90 Pleural effusion, not elsewhere classified: Secondary | ICD-10-CM

## 2021-01-28 ENCOUNTER — Ambulatory Visit (HOSPITAL_COMMUNITY): Payer: 59

## 2021-01-28 ENCOUNTER — Other Ambulatory Visit (HOSPITAL_COMMUNITY): Payer: Self-pay | Admitting: Radiology

## 2021-01-28 ENCOUNTER — Ambulatory Visit (HOSPITAL_COMMUNITY)
Admission: RE | Admit: 2021-01-28 | Discharge: 2021-01-28 | Disposition: A | Discharge status: Home / Self Care | Payer: 59 | Source: Ambulatory Visit | Attending: Radiology | Admitting: Radiology

## 2021-01-28 ENCOUNTER — Ambulatory Visit (HOSPITAL_COMMUNITY)
Admission: RE | Admit: 2021-01-28 | Discharge: 2021-01-28 | Disposition: A | Discharge status: Home / Self Care | Payer: 59 | Source: Ambulatory Visit | Attending: Family Medicine | Admitting: Family Medicine

## 2021-01-28 ENCOUNTER — Other Ambulatory Visit: Payer: Self-pay

## 2021-01-28 DIAGNOSIS — J90 Pleural effusion, not elsewhere classified: Secondary | ICD-10-CM | POA: Insufficient documentation

## 2021-01-28 HISTORY — PX: IR THORACENTESIS ASP PLEURAL SPACE W/IMG GUIDE: IMG5380

## 2021-01-28 MED ORDER — LIDOCAINE HCL (PF) 1 % IJ SOLN
INTRAMUSCULAR | Status: DC | PRN
  Administered 2021-01-28: 10 mL

## 2021-01-28 MED ORDER — LIDOCAINE HCL 1 % IJ SOLN
INTRAMUSCULAR | Status: AC
  Filled 2021-01-28: qty 20

## 2021-01-28 NOTE — Procedures (Signed)
PROCEDURE SUMMARY:  Successful US guided right thoracentesis. Yielded 2.1 L of bloody fluid. Pt tolerated procedure well. No immediate complications.  Specimen was not sent for labs. CXR ordered.  EBL < 5 mL  Brayton El PA-C 01/28/2021 10:52 AM

## 2021-02-04 ENCOUNTER — Ambulatory Visit
Admission: RE | Admit: 2021-02-04 | Discharge: 2021-02-04 | Disposition: A | Discharge status: Home / Self Care | Payer: 59 | Source: Ambulatory Visit | Attending: Family Medicine | Admitting: Family Medicine

## 2021-02-04 ENCOUNTER — Other Ambulatory Visit: Payer: Self-pay

## 2021-02-04 ENCOUNTER — Other Ambulatory Visit: Payer: Self-pay | Admitting: Family Medicine

## 2021-02-04 DIAGNOSIS — R059 Cough, unspecified: Secondary | ICD-10-CM

## 2021-02-08 ENCOUNTER — Other Ambulatory Visit: Payer: Self-pay | Admitting: Thoracic Surgery (Cardiothoracic Vascular Surgery)

## 2021-02-08 DIAGNOSIS — J90 Pleural effusion, not elsewhere classified: Secondary | ICD-10-CM

## 2021-02-09 ENCOUNTER — Institutional Professional Consult (permissible substitution): Payer: 59 | Admitting: Thoracic Surgery (Cardiothoracic Vascular Surgery)

## 2021-02-09 ENCOUNTER — Encounter: Payer: Self-pay | Admitting: Thoracic Surgery (Cardiothoracic Vascular Surgery)

## 2021-02-09 ENCOUNTER — Other Ambulatory Visit: Payer: Self-pay | Admitting: Thoracic Surgery (Cardiothoracic Vascular Surgery)

## 2021-02-09 ENCOUNTER — Ambulatory Visit
Admission: RE | Admit: 2021-02-09 | Discharge: 2021-02-09 | Disposition: A | Discharge status: Home / Self Care | Payer: 59 | Source: Ambulatory Visit | Attending: Thoracic Surgery (Cardiothoracic Vascular Surgery) | Admitting: Thoracic Surgery (Cardiothoracic Vascular Surgery)

## 2021-02-09 ENCOUNTER — Other Ambulatory Visit: Payer: Self-pay

## 2021-02-09 VITALS — BP 134/91 | HR 94 | Resp 20 | Wt 248.0 lb

## 2021-02-09 DIAGNOSIS — J90 Pleural effusion, not elsewhere classified: Secondary | ICD-10-CM | POA: Diagnosis not present

## 2021-02-09 NOTE — Progress Notes (Signed)
PCP is Blair Heys, MD Referring Provider is Blair Heys, MD  Chief Complaint  Patient presents with  . Pleural Effusion    Initial surgical consult, Thoracentesis 3/31, cxr today    HPI: Carlos [name] is sent for consultation regarding a right pleural effusion.  Carlos [name] is a 50 year old man with a past history of type 2 diabetes and hypercholesterolemia.  He was in his usual state of health until the end of February.  He was walking down some wooden stairs in his home and fell landing on his back a little to the right side.  He had a lot of pain initially.  He had one episode of fever afterwards and then was having issues with coughing and ongoing chest pain.  He was seen at a walk-in clinic and noted to have decreased breath sounds.  He was treated with doxycycline.  A chest x-ray on 01/05/2021 showed a moderate right pleural effusion and possible pneumonia.  His symptoms did not improve.  He had a repeat chest x-ray which showed a persistent effusion.  He had an ultrasound-guided thoracentesis on 01/28/2021.  2.1 L of bloody fluid was evacuated.  There was a residual effusion on his chest x-ray afterwards.  A follow-up chest x-ray on 02/05/2020 showed persistent and now probably loculated right pleural effusion.  Chest x-ray today shows little change.  He continues to have persistent cough.  He has some shortness of breath with exertion.  He does have chest discomfort particularly when coughing.  Zubrod Score: At the time of surgery this patient's most appropriate activity status/level should be described as: []     0    Normal activity, no symptoms [x]     1    Restricted in physical strenuous activity but ambulatory, able to do out light work []     2    Ambulatory and capable of self care, unable to do work activities, up and about >50 % of waking hours                              []     3    Only limited self care, in bed greater than 50% of waking hours []     4    Completely disabled,  no self care, confined to bed or chair []     5    Moribund  Past Medical History:  Diagnosis Date  . Diabetes mellitus without complication (HCC)    diet controlled  . Hypercholesteremia     Past Surgical History:  Procedure Laterality Date  . INCISION AND DRAINAGE OF WOUND Right 12/30/2014   Procedure: INCISION AND DRAINAGE RIGHT FLEXOR SHEATH  ;  Surgeon: , MD;  Location: [facility];  Service: Orthopedics;  Laterality: Right;  . IR THORACENTESIS ASP PLEURAL SPACE W/IMG GUIDE  01/28/2021  . ROTATOR CUFF REPAIR Right     History reviewed. No pertinent family history.  Social History Social History   Tobacco Use  . Smoking status: Never Smoker  Substance Use Topics  . Alcohol use: Yes    Comment: social  . Drug use: No    Current Outpatient Medications  Medication Sig Dispense Refill  . atorvastatin (LIPITOR) 40 MG tablet Take 40 mg by mouth daily.  3  . citalopram (CELEXA) 20 MG tablet Take 20 mg by mouth daily.    . fluticasone (FLONASE) 50 MCG/ACT nasal spray Place into both nostrils daily.     glimepiride (AMARYL) 1 MG tablet Take 1 mg by mouth daily with breakfast.    . meloxicam (MOBIC) 15 MG tablet Take one tablet everyday for 7 days, then take as needed.  Take with food 40 tablet 0  . metFORMIN (GLUCOPHAGE-XR) 500 MG 24 hr tablet Take 500 mg by mouth daily with breakfast.    . rosuvastatin (CRESTOR) 40 MG tablet Take 40 mg by mouth daily.    . valACYclovir (VALTREX) 1000 MG tablet TAKE 2 TABLET TWICE A DAY FOR 2 DAYS AT FIRST SIGN OF AN OUTBREAK  3  . Krill Oil 1000 MG CAPS Take by mouth. (Patient not taking: Reported on 02/09/2021)     No current facility-administered medications for this visit.    No Known Allergies  Review of Systems  Constitutional: Positive for activity change, appetite change and fatigue. Negative for unexpected weight change.  HENT: Negative for trouble swallowing and voice change.   Respiratory: Positive for  cough, shortness of breath and wheezing.   Cardiovascular: Negative for chest pain.  Genitourinary: Negative for difficulty urinating.  Musculoskeletal: Positive for myalgias (Leg cramps).  Neurological: Negative for dizziness, seizures and syncope.  Hematological: Negative for adenopathy. Does not bruise/bleed easily.  All other systems reviewed and are negative.   BP (!) 134/91 (BP Location: Right Arm, Patient Position: Sitting)   Pulse 94   Resp 20   Wt 248 lb (112.5 kg)   SpO2 93% Comment: RA  BMI 32.72 kg/m  Physical Exam Vitals reviewed.  Constitutional:      General: He is not in acute distress.    Appearance: Normal appearance.  HENT:     Head: Normocephalic and atraumatic.  Eyes:     Extraocular Movements: Extraocular movements intact.  Cardiovascular:     Rate and Rhythm: Normal rate and regular rhythm.     Heart sounds: Normal heart sounds. No murmur heard. No friction rub. No gallop.   Pulmonary:     Effort: Pulmonary effort is normal. No respiratory distress.     Breath sounds: No wheezing.     Comments: Diminished breath sounds right base Abdominal:     General: There is no distension.     Palpations: Abdomen is soft.     Tenderness: There is no abdominal tenderness.  Musculoskeletal:        General: No swelling.     Cervical back: Neck supple.  Lymphadenopathy:     Cervical: No cervical adenopathy.  Skin:    General: Skin is warm and dry.  Neurological:     General: No focal deficit present.     Mental Status: He is alert and oriented to person, place, and time.     Cranial Nerves: No cranial nerve deficit.     Motor: No weakness.     Gait: Gait normal.    Diagnostic Tests: CHEST - 2 VIEW  COMPARISON:  02/04/2021  FINDINGS: Moderate right pleural effusion. Right lower lobe airspace disease could reflect atelectasis or infiltrate. No confluent opacity on the left. Lingular scarring. Heart is normal size.  IMPRESSION: Stable moderate  right pleural effusion with right lower lobe atelectasis or infiltrate.   Electronically Signed   By: Charlett Nose M.D.   On: 02/09/2021 10:15 I personally reviewed his chest x-ray images.  There is a moderate right pleural effusion that tracks up laterally.  Relatively stable from his chest x-ray on 4/7.  Increased from his post thoracentesis film.  Impression: Carlos [name] is a 50 year old man with a  history of type 2 diabetes and hyperlipidemia.  He is a lifelong non-smoker.  He presents with cough shortness of breath chest discomfort and general malaise.  He has a moderate, probably loculated right pleural effusion.  Thoracentesis in March drained 2.1 L of fluid.  There was some residual effusion present post drainage.  His effusion did recur.  The fluid was not sent for studies.  It was noted to be bloody.  It is most likely a post traumatic effusion/hemothorax.  Is also possible he developed pneumonia due to splinting and this is a parapneumonic effusion.  Malignant effusion is in the differential diagnosis but is very unlikely.  I recommended that we proceed with right VATS for drainage of the effusion and possible decortication.  I suspect he will have some degree of a pleural peel on the lung at this point.  I described the proposed operation to him.  We would do this minimally invasively.  He would have some drainage tubes in postoperatively.  Of course we would need to do this under general anesthesia.  I discussed with him the expected postoperative stay and then the post discharge recovery.  He was very unhappy with the time period involved.  An alternative would be to repeat a thoracentesis and test the fluid.  While it is possible that approach could successfully treat the effusion, I think it is very unlikely.  If he chooses not to have surgery that would be my recommendation.  He wishes to think about this and talk it over with his wife before he makes any decisions.  If he would  like to come back and discuss it further, I will be happy to make arrangements for that.  Plan: Patient will let us know when he decides whether to attempt another thoracentesis or to proceed with definitive surgical intervention.  I spent over 30 minutes in review of records, images, and consultation with Mr. Mayse today. Loreli Slot, MD [facility] [phone]

## 2021-02-10 ENCOUNTER — Other Ambulatory Visit (HOSPITAL_COMMUNITY)
Admission: RE | Admit: 2021-02-10 | Discharge: 2021-02-10 | Disposition: A | Discharge status: Home / Self Care | Payer: 59 | Source: Ambulatory Visit | Attending: Thoracic Surgery (Cardiothoracic Vascular Surgery) | Admitting: Thoracic Surgery (Cardiothoracic Vascular Surgery)

## 2021-02-10 DIAGNOSIS — Z20822 Contact with and (suspected) exposure to covid-19: Secondary | ICD-10-CM | POA: Diagnosis not present

## 2021-02-10 DIAGNOSIS — Z01812 Encounter for preprocedural laboratory examination: Secondary | ICD-10-CM | POA: Insufficient documentation

## 2021-02-10 LAB — SARS CORONAVIRUS 2 (TAT 6-24 HRS): SARS Coronavirus 2: NEGATIVE

## 2021-02-11 ENCOUNTER — Encounter: Payer: 59 | Admitting: Thoracic Surgery (Cardiothoracic Vascular Surgery)

## 2021-02-12 ENCOUNTER — Ambulatory Visit (HOSPITAL_COMMUNITY)
Admission: RE | Admit: 2021-02-12 | Discharge: 2021-02-12 | Disposition: A | Discharge status: Home / Self Care | Payer: 59 | Source: Ambulatory Visit | Attending: Thoracic Surgery (Cardiothoracic Vascular Surgery) | Admitting: Thoracic Surgery (Cardiothoracic Vascular Surgery)

## 2021-02-12 ENCOUNTER — Other Ambulatory Visit: Payer: Self-pay | Admitting: Thoracic Surgery (Cardiothoracic Vascular Surgery)

## 2021-02-12 ENCOUNTER — Other Ambulatory Visit: Payer: Self-pay

## 2021-02-12 DIAGNOSIS — J90 Pleural effusion, not elsewhere classified: Secondary | ICD-10-CM

## 2021-02-12 HISTORY — PX: IR THORACENTESIS ASP PLEURAL SPACE W/IMG GUIDE: IMG5380

## 2021-02-12 LAB — BODY FLUID CELL COUNT WITH DIFFERENTIAL
Eos, Fluid: 36 %
Lymphs, Fluid: 60 %
Monocyte-Macrophage-Serous Fluid: 4 % — ABNORMAL LOW (ref 50–90)
Neutrophil Count, Fluid: 0 % (ref 0–25)
Total Nucleated Cell Count, Fluid: 2108 cu mm — ABNORMAL HIGH (ref 0–1000)

## 2021-02-12 LAB — GLUCOSE, PLEURAL OR PERITONEAL FLUID: Glucose, Fluid: 107 mg/dL

## 2021-02-12 LAB — PROTEIN, PLEURAL OR PERITONEAL FLUID: Total protein, fluid: 4.4 g/dL

## 2021-02-12 LAB — LACTATE DEHYDROGENASE, PLEURAL OR PERITONEAL FLUID: LD, Fluid: 373 U/L — ABNORMAL HIGH (ref 3–23)

## 2021-02-12 MED ORDER — LIDOCAINE HCL (PF) 1 % IJ SOLN
INTRAMUSCULAR | Status: DC | PRN
  Administered 2021-02-12: 10 mL

## 2021-02-12 MED ORDER — LIDOCAINE HCL 1 % IJ SOLN
INTRAMUSCULAR | Status: AC
  Filled 2021-02-12: qty 20

## 2021-02-12 NOTE — Procedures (Signed)
Ultrasound-guided diagnostic and therapeutic right thoracentesis performed yielding 50 mililiters of red colored fluid with white debris noted. No immediate complications.  Diagnostic fluid was sent to the lab for further analysis. Follow-up chest x-ray pending. EBL is < 2 ml. Pleural effusion noted to be loculated.

## 2021-02-15 LAB — CYTOLOGY - NON PAP

## 2021-02-22 ENCOUNTER — Ambulatory Visit
Admission: RE | Admit: 2021-02-22 | Discharge: 2021-02-22 | Disposition: A | Discharge status: Home / Self Care | Payer: 59 | Source: Ambulatory Visit | Attending: Thoracic Surgery (Cardiothoracic Vascular Surgery) | Admitting: Thoracic Surgery (Cardiothoracic Vascular Surgery)

## 2021-02-22 DIAGNOSIS — J90 Pleural effusion, not elsewhere classified: Secondary | ICD-10-CM

## 2021-03-21 NOTE — Progress Notes (Signed)
Cardiology Office Note   Date:  03/24/2021   ID:  Carlos [name], DOB 05-Nov-1970, MRN [id number]  PCP:  Blair Heys, MD  Cardiologist:   Deyton Ellenbecker [place], MD   Chief Complaint  Patient presents with  . Coronary Artery Disease      History of Present Illness: Carlos [name] is a 50 y.o. male who is seen at the request of Dr Manus Gunning for evaluation of coronary calcification noted on CT. He has a history of DM and HLD.   He had a fall back in March injuring his ribs and developed a pleural effusion. He had thoracentesis with 2 liters of bloody effusion removed. He had persistent pleural effusion but repeat US guided thoracentesis in April obtained only 50 cc. CT scan showed a loculated effusion. Has seen Dr Dorris Fetch with some discussion of having surgery but patient reports feeling well and is reluctant to have surgery. Now on prednisone with repeat CXR planned. On his CT he was noted to have coronary calcification.   He denies any chest pain. No current DOE. No palpitations, dizziness, edema or claudication. Notes his father died at age 77 with MI.     Past Medical History:  Diagnosis Date  . Diabetes mellitus without complication (HCC)    diet controlled  . Hypercholesteremia     Past Surgical History:  Procedure Laterality Date  . INCISION AND DRAINAGE OF WOUND Right 12/30/2014   Procedure: INCISION AND DRAINAGE RIGHT FLEXOR SHEATH  ;  Surgeon: Cindee Salt, MD;  Location: [facility];  Service: Orthopedics;  Laterality: Right;  . IR THORACENTESIS ASP PLEURAL SPACE W/IMG GUIDE  01/28/2021  . IR THORACENTESIS ASP PLEURAL SPACE W/IMG GUIDE  02/12/2021  . ROTATOR CUFF REPAIR Right      Current Outpatient Medications  Medication Sig Dispense Refill  . citalopram (CELEXA) 20 MG tablet Take 20 mg by mouth daily.    . fluticasone (FLONASE) 50 MCG/ACT nasal spray Place into both nostrils daily.    Marland Kitchen glimepiride (AMARYL) 1 MG tablet Take 1 mg by mouth daily with  breakfast.    . metFORMIN (GLUCOPHAGE-XR) 500 MG 24 hr tablet Take 1,500 mg by mouth daily with breakfast.    . pioglitazone (ACTOS) 15 MG tablet Take 15 mg by mouth daily.    . predniSONE (DELTASONE) 10 MG tablet Take 1 tablet (10 mg total) by mouth daily with breakfast. 21 tablet 0  . rosuvastatin (CRESTOR) 40 MG tablet Take 40 mg by mouth daily.    . valACYclovir (VALTREX) 1000 MG tablet TAKE 2 TABLET TWICE A DAY FOR 2 DAYS AT FIRST SIGN OF AN OUTBREAK  3  . meloxicam (MOBIC) 15 MG tablet Take one tablet everyday for 7 days, then take as needed.  Take with food (Patient not taking: Reported on 03/24/2021) 40 tablet 0   No current facility-administered medications for this visit.    Allergies:   Patient has no known allergies.    Social History:  The patient  reports that he has never smoked. He has never used smokeless tobacco. He reports current alcohol use. He reports that he does not use drugs.   Family History:  The patient's family history includes Alzheimer's disease in his mother; Heart attack in his father.    ROS:  Please see the history of present illness.   Otherwise, review of systems are positive for none.   All other systems are reviewed and negative.    PHYSICAL EXAM: VS:  BP 130/78 (BP Location: Right Arm, Patient Position: Sitting, Cuff Size: Normal)   Pulse 62   Ht 6' 1.5" (1.867 m)   Wt 252 lb 6.4 oz (114.5 kg)   BMI 32.85 kg/m  , BMI Body mass index is 32.85 kg/m. GEN: Well nourished, well developed, in no acute distress  HEENT: normal  Neck: no JVD, carotid bruits, or masses Cardiac: RRR; no murmurs, rubs, or gallops,no edema  Respiratory:  clear to auscultation bilaterally, normal work of breathing GI: soft, nontender, nondistended, + BS MS: no deformity or atrophy  Skin: warm and dry, no rash Neuro:  Strength and sensation are intact Psych: euthymic mood, full affect   EKG:  EKG is ordered today. The ekg ordered today demonstrates NSR rate 62.  Normal Ecg. I have personally reviewed and interpreted this study.    Recent Labs: No results found for requested labs within last 8760 hours.    Lipid Panel No results found for: CHOL, TRIG, HDL, CHOLHDL, VLDL, LDLCALC, LDLDIRECT    Wt Readings from Last 3 Encounters:  03/24/21 252 lb 6.4 oz (114.5 kg)  03/23/21 248 lb (112.5 kg)  02/09/21 248 lb (112.5 kg)    Labs dated 03/11/21: a1C 7.3%. cholesterol 138, triglycerides 135, HDL 36, LDL 78. Glucose 163, otherwise CMET normal.   Other studies Reviewed: Additional studies/ records that were reviewed today include:   CLINICAL DATA:  Follow-up of pleural effusion. Status post thoracentesis x2. Nonsmoker.  EXAM: CT CHEST WITHOUT CONTRAST  TECHNIQUE: Multidetector CT imaging of the chest was performed following the standard protocol without IV contrast.  COMPARISON:  Multiple chest radiographs, most recent 02/12/2021.  FINDINGS: Cardiovascular: Aortic atherosclerosis. Tortuous thoracic aorta. Normal heart size, without pericardial effusion. Multivessel coronary artery atherosclerosis.  Mediastinum/Nodes: No supraclavicular adenopathy. No mediastinal or definite hilar adenopathy, given limitations of unenhanced CT.  Lungs/Pleura: Small right pleural effusion, without loculation. Mild anterior pleural thickening. No well-defined pleural mass, given limitations of noncontrast CT.  Patent airways. Mild right base volume loss and atelectasis. No evidence of pneumonia or underlying suspicious pulmonary mass.  Upper Abdomen: Mild to moderate hepatic steatosis. Mild caudate lobe enlargement. Normal imaged portions of the spleen, stomach, pancreas, adrenal glands, kidneys.  Musculoskeletal: Tenth and eleventh posterior right rib fractures, incompletely healed. Mild midthoracic spondylosis.  IMPRESSION: 1. Small right pleural effusion, without causative mass or adenopathy identified. 2. Right tenth and eleventh  posterior rib fractures, incompletely healed. 3. Age advanced coronary artery atherosclerosis. Recommend assessment of coronary risk factors and consideration of medical therapy. 4. Hepatic steatosis with nonspecific caudate lobe enlargement. Correlate with risk factors for liver disease. 5.  Aortic Atherosclerosis (ICD10-I70.0).   Electronically Signed   By: Jeronimo Greaves M.D.   On: 02/22/2021 15:28   ASSESSMENT AND PLAN:  1.  Coronary artery calcification- I reviewed his CT. He has some calcification in the LAD, ostial and proximal RCA and severe calcification in the LCx. He is currently asymptomatic but has risk factors of HLD, DM and family history of premature CAD. I would recommend a ETT. Will allow him to recover from his chest injury and plan on doing ETT in 3 months. If low risk would focus on risk factor modification. In the long run I would recommend he take a baby ASA a day since calcium score is clearly > 300.   2. DM type 2 - per primary care  3. Hypercholesterolemia. Given other risk factors and findings on CT I think his target LDL should be less  than 70. He reports with Crestor as opposed to Zocor on lipitor cholesterol has dropped from 200 to 138. I would follow this closely. If LDL remains over 70 I would consider adding therapy such as Zetia.   4. Family history of premature CAD  5. S/p fall with rib injury and traumatic pleural effusion. Will await resolution of this before starting ASA.    Current medicines are reviewed at length with the patient today.  The patient does not have concerns regarding medicines.  The following changes have been made:  Consider adding ASA 81 mg daily in the future.  Labs/ tests ordered today include:   Orders Placed This Encounter  Procedures  . Cardiac Stress Test: Informed Consent Details: Physician/Practitioner Attestation; Transcribe to consent form and obtain patient signature  . Exercise Tolerance Test  . EKG 12-Lead      Disposition:   FU with me post ETT   Signed, Rondy Krupinski [place], MD  03/24/2021 9:04 AM    [facility] [address] [place], [place], [address] Phone [phone], Fax [phone]

## 2021-03-23 ENCOUNTER — Other Ambulatory Visit: Payer: Self-pay | Admitting: Thoracic Surgery (Cardiothoracic Vascular Surgery)

## 2021-03-23 ENCOUNTER — Encounter: Payer: Self-pay | Admitting: Thoracic Surgery (Cardiothoracic Vascular Surgery)

## 2021-03-23 ENCOUNTER — Ambulatory Visit
Admission: RE | Admit: 2021-03-23 | Discharge: 2021-03-23 | Disposition: A | Discharge status: Home / Self Care | Payer: 59 | Source: Ambulatory Visit | Attending: Thoracic Surgery (Cardiothoracic Vascular Surgery) | Admitting: Thoracic Surgery (Cardiothoracic Vascular Surgery)

## 2021-03-23 ENCOUNTER — Other Ambulatory Visit: Payer: Self-pay

## 2021-03-23 ENCOUNTER — Ambulatory Visit: Payer: 59 | Admitting: Thoracic Surgery (Cardiothoracic Vascular Surgery)

## 2021-03-23 VITALS — BP 125/85 | HR 76 | Resp 20 | Ht 73.0 in | Wt 248.0 lb

## 2021-03-23 DIAGNOSIS — J90 Pleural effusion, not elsewhere classified: Secondary | ICD-10-CM

## 2021-03-23 MED ORDER — PREDNISONE 10 MG PO TABS: 10.0000 mg | ORAL_TABLET | Freq: Every day | ORAL | 0 refills | Status: AC

## 2021-03-23 NOTE — Progress Notes (Signed)
[address]       [place] [address]             [phone]    HPI: Carlos [name] returns for follow-up regarding his pleural effusion.  Carlos [name] is a 50 year old man with a history of type 2 diabetes and hyperlipidemia.  He had a fall back in February and landed on his right side.  He was having a lot of coughing and chest pain.  He was treated empirically with antibiotics for possible pneumonia.  Chest x-ray in March showed a moderate right pleural effusion and possible pneumonia.  He had a thoracentesis on 01/28/2021 which drained 2.1 L of bloody fluid.  There was still residual effusion afterwards.  Follow-up chest x-ray on 02/05/2020 showed persistent pleural effusion.  I saw him on 02/09/2021.  He did not want to have surgery for definitive treatment of the effusion.  We did a repeat ultrasound-guided thoracentesis.  This time it was noted to be a loculated and only about 50 mL of fluid was evacuated.  Studies of the fluid showed inflammatory markers.  Past Medical History:  Diagnosis Date  . Diabetes mellitus without complication (HCC)    diet controlled  . Hypercholesteremia     Current Outpatient Medications  Medication Sig Dispense Refill  . citalopram (CELEXA) 20 MG tablet Take 20 mg by mouth daily.    . fluticasone (FLONASE) 50 MCG/ACT nasal spray Place into both nostrils daily.    Marland Kitchen glimepiride (AMARYL) 1 MG tablet Take 1 mg by mouth daily with breakfast.    . metFORMIN (GLUCOPHAGE-XR) 500 MG 24 hr tablet Take 1,500 mg by mouth daily with breakfast.    . pioglitazone (ACTOS) 15 MG tablet Take 15 mg by mouth daily.    . rosuvastatin (CRESTOR) 40 MG tablet Take 40 mg by mouth daily.    . valACYclovir (VALTREX) 1000 MG tablet TAKE 2 TABLET TWICE A DAY FOR 2 DAYS AT FIRST SIGN OF AN OUTBREAK  3  . meloxicam (MOBIC) 15 MG tablet Take one tablet everyday for 7 days, then take as needed.  Take with food (Patient not taking: Reported on 03/23/2021) 40 tablet 0  .  predniSONE (DELTASONE) 10 MG tablet Take 1 tablet (10 mg total) by mouth daily with breakfast. 21 tablet 0   No current facility-administered medications for this visit.    Physical Exam BP 125/85   Pulse 76   Resp 20   Ht 6\' 1"  (1.854 m)   Wt 248 lb (112.5 kg)   SpO2 96% Comment: RA  BMI 32.70 kg/m  50 year old man in no acute distress Alert and oriented x3 with no focal deficits Diminished breath sounds at right base  Diagnostic Tests: I personally reviewed the chest x-ray.  Shows a small loculated pleural effusion.  Improved from his film and April.  Impression: Carlos [name] is a 50 year old man with a history of hyperlipidemia and type 2 diabetes.  He had a fall back in February.  He had a chest injury but did not seek immediate medical attention.  He then had a fever and cough.  He was treated empirically for pneumonia.  Chest x-ray showed a pleural effusion.  Thoracentesis drained 2.1 L of fluid initially.  The fluid was bloody.  Probably posttraumatic.  On follow-up there was a persistent loculated pleural effusion.  I saw him a couple weeks ago and recommended VATS for drainage of the effusion and decortication.  He did not  want to have surgery so we tried another thoracentesis.  Only about 50 mL of fluid was drained that was loculated.  He is doing reasonably well symptomatically.  He still not interested in having surgery.  He does understand that he may have persistent volume loss in that right lung.  He also understands that delaying could make surgery more difficult should be ultimately have to do that.  The very least I think we should do a trial with prednisone.  I would just put him on 10 mg a day for about 3 weeks and then repeat a chest x-ray about a month later.  If his symptoms worsen, he should contact us in the meantime.  He was noted to have coronary calcification on his CT and he is scheduled to see a cardiologist this week.  Plan: Prednisone taper Return in  2 months with PA lateral chest x-ray  Loreli Slot, MD [facility] [phone]

## 2021-03-24 ENCOUNTER — Encounter: Payer: Self-pay | Admitting: Cardiology

## 2021-03-24 ENCOUNTER — Ambulatory Visit: Payer: 59 | Admitting: Cardiology

## 2021-03-24 VITALS — BP 130/78 | HR 62 | Ht 73.5 in | Wt 252.4 lb

## 2021-03-24 DIAGNOSIS — E119 Type 2 diabetes mellitus without complications: Secondary | ICD-10-CM

## 2021-03-24 DIAGNOSIS — E7800 Pure hypercholesterolemia, unspecified: Secondary | ICD-10-CM | POA: Diagnosis not present

## 2021-03-24 DIAGNOSIS — I2510 Atherosclerotic heart disease of native coronary artery without angina pectoris: Secondary | ICD-10-CM | POA: Diagnosis not present

## 2021-03-24 NOTE — Patient Instructions (Signed)
Medication Instructions:  Continue same medications   Lab Work: None ordered   Testing/Procedures: Schedule treadmill ( POET ) in 3 months   Follow-Up: At [facility], you and your health needs are our priority.  As part of our continuing mission to provide you with exceptional heart care, we have created designated Provider Care Teams.  These Care Teams include your primary Cardiologist (physician) and Advanced Practice Providers (APPs -  Physician Assistants and Nurse Practitioners) who all work together to provide you with the care you need, when you need it.  We recommend signing up for the patient portal called "MyChart".  Sign up information is provided on this After Visit Summary.  MyChart is used to connect with patients for Virtual Visits (Telemedicine).  Patients are able to view lab/test results, encounter notes, upcoming appointments, etc.  Non-urgent messages can be sent to your provider as well.   To learn more about what you can do with MyChart, go to [email or web address].    Your next appointment:  Will be determined after stress test   The format for your next appointment: Office    Provider:  Dr.Jordan

## 2021-05-31 ENCOUNTER — Other Ambulatory Visit: Payer: Self-pay | Admitting: Thoracic Surgery (Cardiothoracic Vascular Surgery)

## 2021-05-31 DIAGNOSIS — J90 Pleural effusion, not elsewhere classified: Secondary | ICD-10-CM

## 2021-06-01 ENCOUNTER — Encounter: Payer: Self-pay | Admitting: Thoracic Surgery (Cardiothoracic Vascular Surgery)

## 2021-06-01 ENCOUNTER — Ambulatory Visit
Admission: RE | Admit: 2021-06-01 | Discharge: 2021-06-01 | Disposition: A | Discharge status: Home / Self Care | Payer: 59 | Source: Ambulatory Visit | Attending: Thoracic Surgery (Cardiothoracic Vascular Surgery) | Admitting: Thoracic Surgery (Cardiothoracic Vascular Surgery)

## 2021-06-01 ENCOUNTER — Ambulatory Visit: Payer: 59 | Admitting: Thoracic Surgery (Cardiothoracic Vascular Surgery)

## 2021-06-01 ENCOUNTER — Other Ambulatory Visit: Payer: Self-pay

## 2021-06-01 VITALS — BP 140/87 | HR 87 | Resp 20 | Ht 73.0 in | Wt 250.0 lb

## 2021-06-01 DIAGNOSIS — J90 Pleural effusion, not elsewhere classified: Secondary | ICD-10-CM | POA: Diagnosis not present

## 2021-06-01 NOTE — Progress Notes (Signed)
[address]       [name] [address]             [phone]     HPI: Carlos [name] returns for follow-up of his right pleural effusion.  Carlos [name] is a 50 year old man with a history of type 2 diabetes and hyperlipidemia.  He fell in February landing on his right side.  He had chest pain and coughing.  He was treated empirically with antibiotics for possible pneumonia.  A chest x-ray in March showed a moderate right pleural effusion.  Thoracentesis drained 2.1 L of bloody fluid.  There was still residual effusion afterwards.  I saw him on 02/09/2021.  Recommended surgery for definitive treatment but he did not want to do that.  We did a repeat ultrasound-guided thoracentesis.  The fluid was loculated and only about 50 mL was drained.  Pleural fluid was positive for inflammatory markers.  Cytology was negative.  I saw him back in May.  He was doing well symptomatically.  He still was not interested in having surgery.  I put him on 10 mg of prednisone a day for 3 weeks.  He now returns for follow-up.  He has been feeling well.  He does note that he feels like he cannot get out of full complete deep breath when he tries.  Otherwise no issues.  Past Medical History:  Diagnosis Date   Diabetes mellitus without complication (HCC)    diet controlled   Hypercholesteremia     Current Outpatient Medications  Medication Sig Dispense Refill   citalopram (CELEXA) 20 MG tablet Take 20 mg by mouth daily.     fluticasone (FLONASE) 50 MCG/ACT nasal spray Place into both nostrils daily.     glimepiride (AMARYL) 1 MG tablet Take 1 mg by mouth daily with breakfast.     meloxicam (MOBIC) 15 MG tablet Take one tablet everyday for 7 days, then take as needed.  Take with food (Patient taking differently: Take one tablet everyday for 7 days, then take as needed.  Take with food) 40 tablet 0   metFORMIN (GLUCOPHAGE-XR) 500 MG 24 hr tablet Take 1,500 mg by mouth daily with breakfast.      pioglitazone (ACTOS) 15 MG tablet Take 15 mg by mouth daily.     predniSONE (DELTASONE) 10 MG tablet Take 1 tablet (10 mg total) by mouth daily with breakfast. 21 tablet 0   rosuvastatin (CRESTOR) 40 MG tablet Take 40 mg by mouth daily.     valACYclovir (VALTREX) 1000 MG tablet TAKE 2 TABLET TWICE A DAY FOR 2 DAYS AT FIRST SIGN OF AN OUTBREAK  3   No current facility-administered medications for this visit.    Physical Exam BP 140/87   Pulse 87   Resp 20   Ht 6\' 1"  (1.854 m)   Wt 250 lb (113.4 kg)   SpO2 94% Comment: RA  BMI 32.74 kg/m  50 year old man in no acute distress Alert and oriented x3 with no focal deficits Diminished breath sounds at right base  Diagnostic Tests: CHEST - 2 VIEW   COMPARISON:  03/23/2021.   FINDINGS: Mediastinum and hilar structures normal. Low lung volumes with mild bibasilar atelectasis again noted. Small right pleural effusion again noted. Interim decrease in size from prior exam. Degenerative change thoracic spine.   IMPRESSION: 1. 2. Small right pleural effusion again noted. Pleural effusion has decreased in size from prior exam.     Electronically Signed  ByMaisie Fus  Register   On: 06/01/2021 09:57   I personally reviewed the chest x-ray images.  There is been a marked improvement in the pleural effusion.  It is now minimal.  Impression: Carlos [name] is a 50 year old man who I developed a right pleural effusion back in April.  This was associated with a fall where he hit his chest.  He also may have had pneumonia around that time.  The effusion was exudative.  He did not want to have surgical drainage.  He was treated initially with thoracentesis that drained about 2 L.  A second attempt at thoracentesis showed loculated fluid only about 50 mL was drained.  He was treated with a 3-week course of prednisone.  Now about 6 weeks after his last chest x-ray there is been significant improvement in the pleural effusion.  Going back to his films  in April there is been near complete resolution.  No further work-up necessary at this time.  He understands that he should get a repeat chest x-ray if he has any respiratory issues.  Plan: Follow-up with Dr. Manus Gunning I will be happy to see Carlos [name] back anytime in the future if I can be of any further assistance with his care  Carlos Slot, MD [facility] [phone]

## 2021-06-04 ENCOUNTER — Encounter (HOSPITAL_COMMUNITY): Payer: 59

## 2021-06-08 ENCOUNTER — Telehealth (HOSPITAL_COMMUNITY): Payer: Self-pay | Admitting: *Deleted

## 2021-06-08 NOTE — Telephone Encounter (Signed)
Close encounter 

## 2021-06-11 ENCOUNTER — Ambulatory Visit (HOSPITAL_COMMUNITY)
Admission: RE | Admit: 2021-06-11 | Discharge: 2021-06-11 | Disposition: A | Discharge status: Home / Self Care | Payer: 59 | Source: Ambulatory Visit | Attending: Cardiology | Admitting: Cardiology

## 2021-06-11 ENCOUNTER — Other Ambulatory Visit: Payer: Self-pay

## 2021-06-11 DIAGNOSIS — I2510 Atherosclerotic heart disease of native coronary artery without angina pectoris: Secondary | ICD-10-CM | POA: Insufficient documentation

## 2021-06-11 DIAGNOSIS — E119 Type 2 diabetes mellitus without complications: Secondary | ICD-10-CM | POA: Insufficient documentation

## 2021-06-11 DIAGNOSIS — E7800 Pure hypercholesterolemia, unspecified: Secondary | ICD-10-CM | POA: Diagnosis present

## 2021-06-11 LAB — EXERCISE TOLERANCE TEST
Estimated workload: 9.5 METS
Exercise duration (min): 7 min
Exercise duration (sec): 40 s
MPHR: 169 {beats}/min
Peak HR: 164 {beats}/min
Percent HR: 97 %
Rest HR: 83 {beats}/min

## 2021-06-15 ENCOUNTER — Other Ambulatory Visit: Payer: Self-pay

## 2023-04-23 IMAGING — DX DG CHEST 2V
2 series · 2 of 2 positions shown · non-contrast
Comparison: 03/23/2021.

CLINICAL DATA: Pleural effusion.

EXAM:
CHEST - 2 VIEW

[dg chest 2 view (1 of 2)]
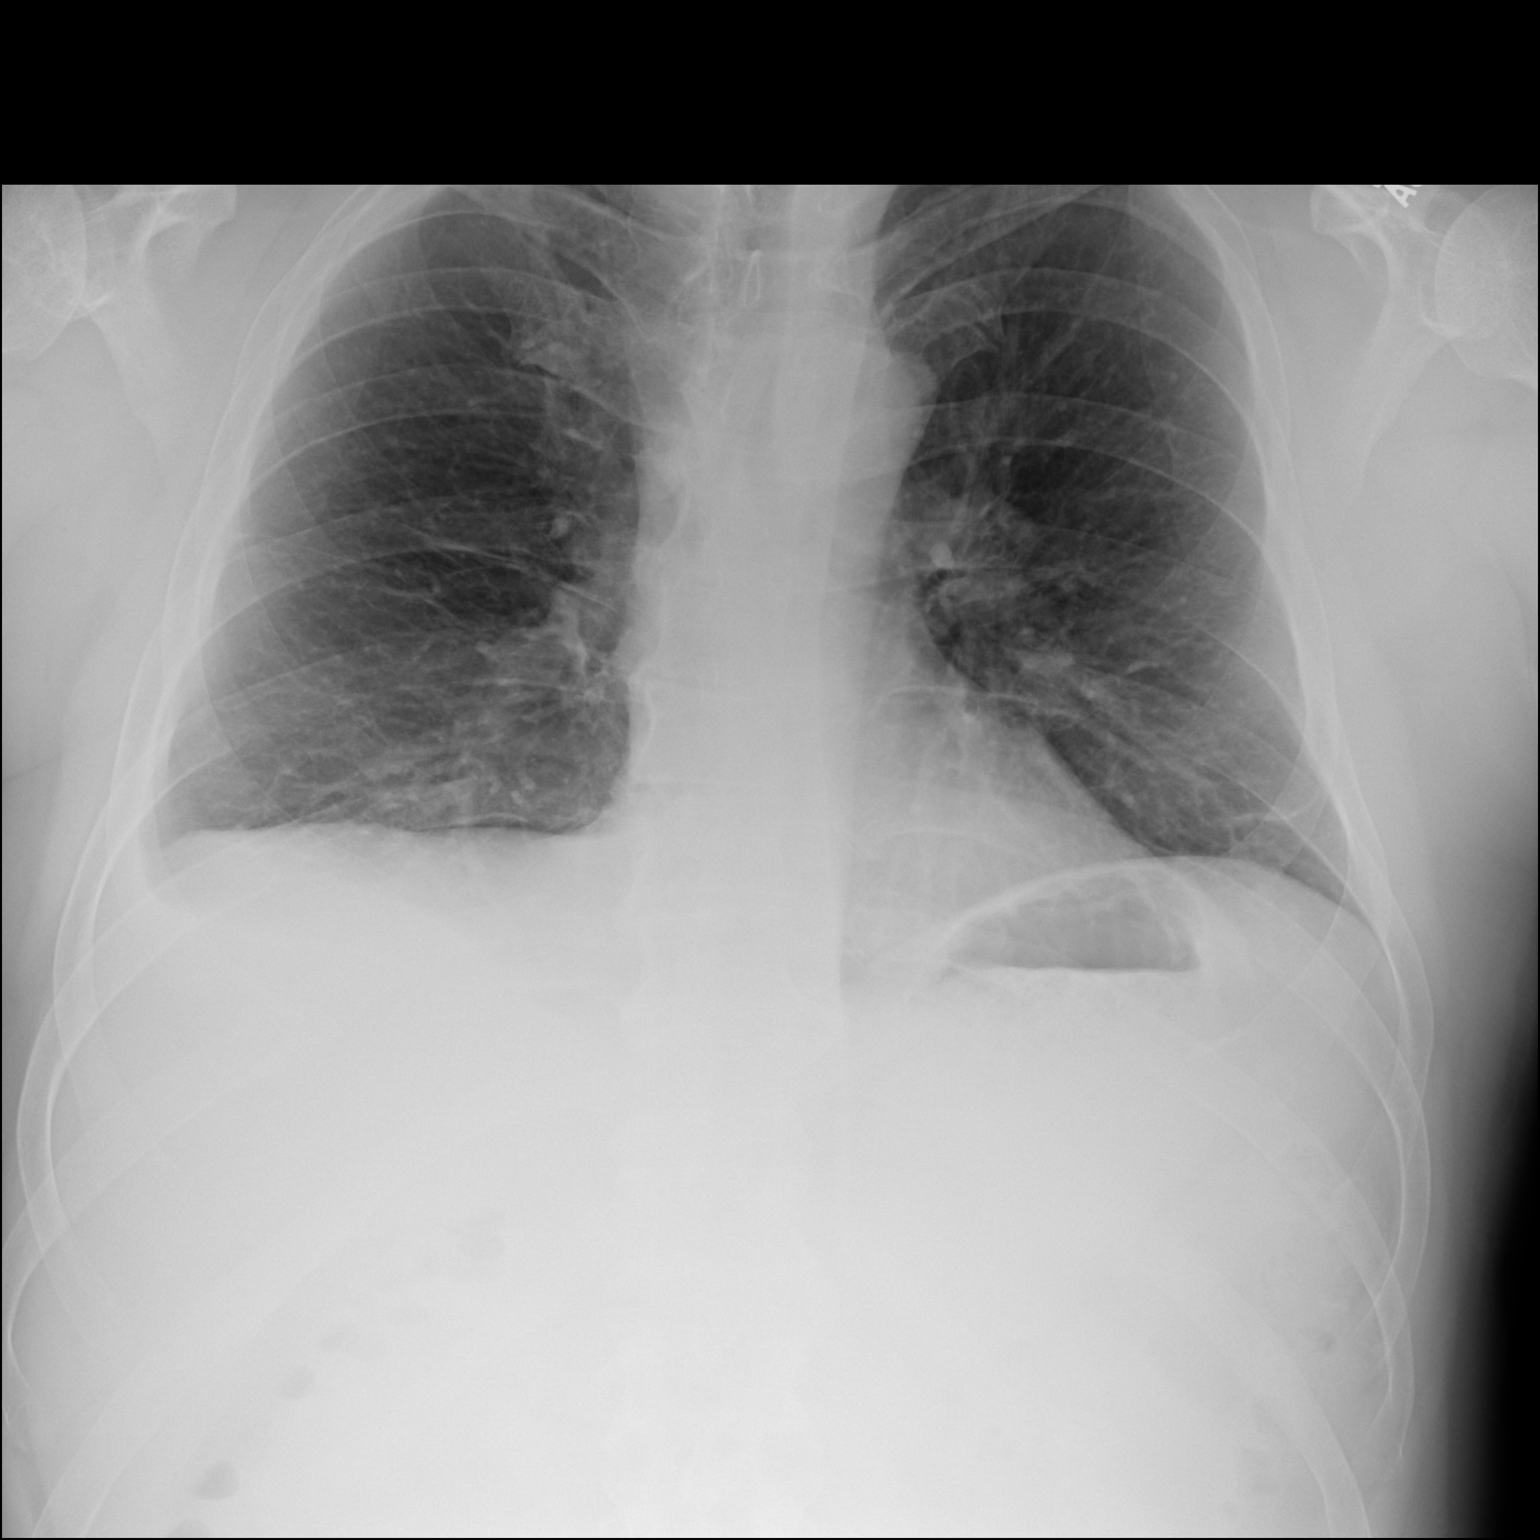

[dg chest 2 view (2 of 2)]
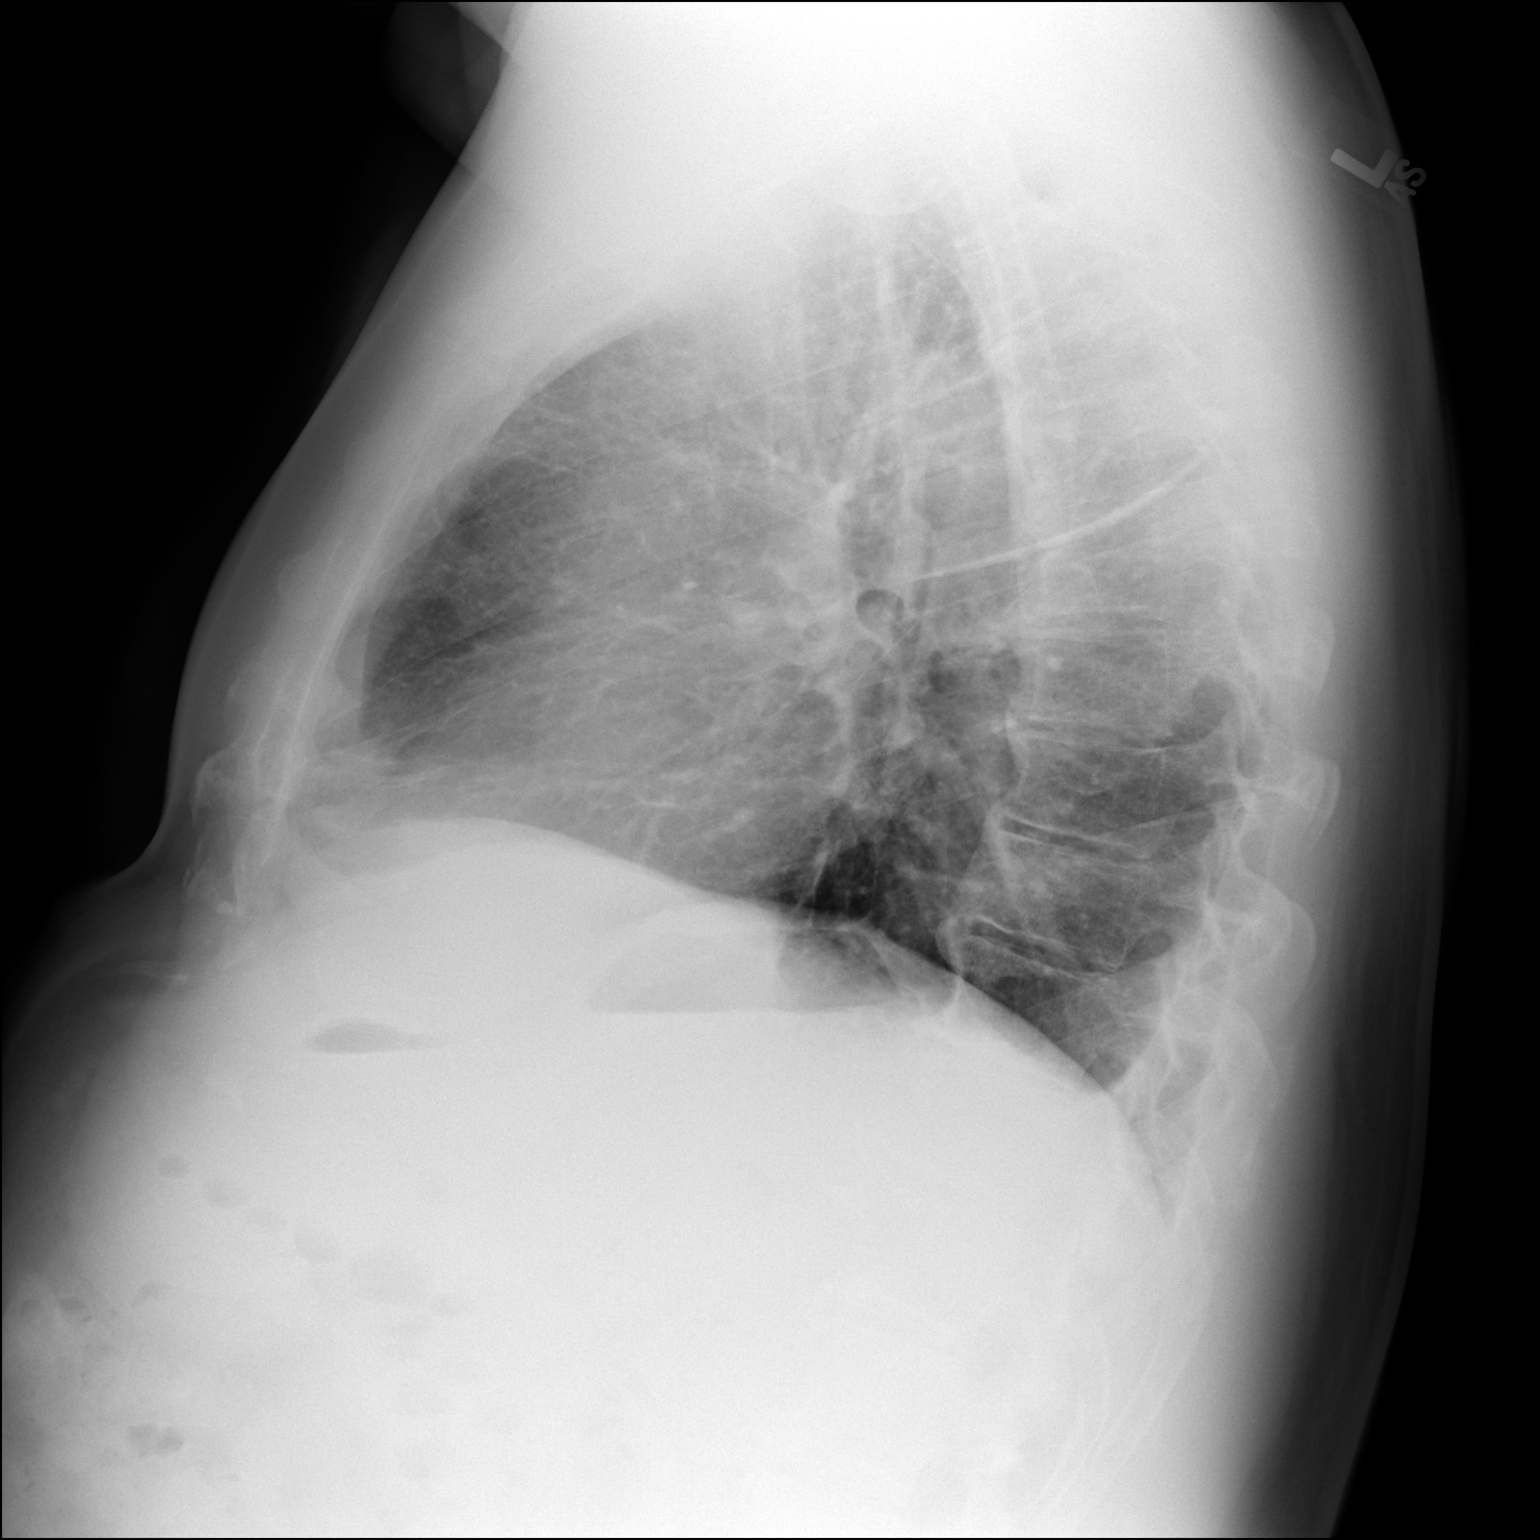

[2 of 2 positions shown; findings below may reference images not displayed]

FINDINGS: Mediastinum and hilar structures normal. Low lung volumes with mild
bibasilar atelectasis again noted. Small right pleural effusion
again noted. Interim decrease in size from prior exam. Degenerative
change thoracic spine.
IMPRESSION: 1. 2. Small right pleural effusion again noted. Pleural effusion has
decreased in size from prior exam.
